# Patient Record
Sex: Female | Born: 1970 | Race: Black or African American | Hispanic: No | Marital: Single | Smoking: Former smoker
Health system: Southern US, Community
[De-identification: ages and names within clinical notes are randomized; demographics above are authoritative.]

## PROBLEM LIST (undated history)

## (undated) DIAGNOSIS — I10 Essential (primary) hypertension: Secondary | ICD-10-CM

## (undated) DIAGNOSIS — D219 Benign neoplasm of connective and other soft tissue, unspecified: Secondary | ICD-10-CM

## (undated) DIAGNOSIS — Z9289 Personal history of other medical treatment: Secondary | ICD-10-CM

## (undated) DIAGNOSIS — M5430 Sciatica, unspecified side: Secondary | ICD-10-CM

## (undated) DIAGNOSIS — F32A Depression, unspecified: Secondary | ICD-10-CM

## (undated) DIAGNOSIS — H6690 Otitis media, unspecified, unspecified ear: Secondary | ICD-10-CM

## (undated) DIAGNOSIS — F329 Major depressive disorder, single episode, unspecified: Secondary | ICD-10-CM

## (undated) DIAGNOSIS — D649 Anemia, unspecified: Secondary | ICD-10-CM

## (undated) HISTORY — DX: Sciatica, unspecified side: M54.30

## (undated) HISTORY — DX: Benign neoplasm of connective and other soft tissue, unspecified: D21.9

## (undated) HISTORY — DX: Otitis media, unspecified, unspecified ear: H66.90

## (undated) HISTORY — PX: ABLATION: SHX5711

## (undated) HISTORY — PX: TUBAL LIGATION: SHX77

## (undated) HISTORY — DX: Essential (primary) hypertension: I10

---

## 2005-06-16 DIAGNOSIS — Z9289 Personal history of other medical treatment: Secondary | ICD-10-CM

## 2005-06-16 HISTORY — DX: Personal history of other medical treatment: Z92.89

## 2005-06-16 HISTORY — PX: OTHER SURGICAL HISTORY: SHX169

## 2012-11-05 ENCOUNTER — Ambulatory Visit (INDEPENDENT_AMBULATORY_CARE_PROVIDER_SITE_OTHER): Payer: Self-pay | Admitting: Obstetrics & Gynecology

## 2012-11-05 ENCOUNTER — Encounter: Payer: Self-pay | Admitting: Obstetrics & Gynecology

## 2012-11-05 VITALS — BP 151/118 | HR 68 | Temp 97.0°F | Ht 67.0 in | Wt 140.0 lb

## 2012-11-05 DIAGNOSIS — N920 Excessive and frequent menstruation with regular cycle: Secondary | ICD-10-CM

## 2012-11-05 DIAGNOSIS — D649 Anemia, unspecified: Secondary | ICD-10-CM | POA: Insufficient documentation

## 2012-11-05 DIAGNOSIS — D259 Leiomyoma of uterus, unspecified: Secondary | ICD-10-CM | POA: Insufficient documentation

## 2012-11-05 LAB — CBC
HCT: 34.3 % — ABNORMAL LOW (ref 36.0–46.0)
MCHC: 32.7 g/dL (ref 30.0–36.0)
MCV: 83.3 fL (ref 78.0–100.0)
Platelets: 240 10*3/uL (ref 150–400)
RDW: 15.2 % (ref 11.5–15.5)
WBC: 9.3 10*3/uL (ref 4.0–10.5)

## 2012-11-05 NOTE — Progress Notes (Signed)
Patient ID: Roberta Young, female   DOB: [birth year], 42 y.o.   MRN: [id number]  Chief Complaint  Patient presents with  . Fibroids  . Menorrhagia    HPI Roberta Young is a 41 y.o. female.  [id number] Patient's last menstrual period was 11/01/2012. H/O menses lasting 7 days, heavy for 2 days. She can't function when flow is heavy. PE and CT scan shows large fibroids, up to 9 cm.   HPI  Past Medical History  Diagnosis Date  . Hypertension     Past Surgical History  Procedure Laterality Date  . Tubal ligation    . Ablation    . Blood transfusion    . Cesarean section      Family History  Problem Relation Age of Onset  . Hypertension Mother   . Hypertension Father   . Hypertension Sister   . Kidney disease Maternal Aunt   . Heart disease Maternal Grandmother   . Heart disease Maternal Grandfather     Social History History  Substance Use Topics  . Smoking status: Current Every Day Smoker -- 0.50 packs/day  . Smokeless tobacco: Not on file  . Alcohol Use: Yes    No Known Allergies  Current Outpatient Prescriptions  Medication Sig Dispense Refill  . hydrochlorothiazide (HYDRODIURIL) 25 MG tablet Take 25 mg by mouth daily.      Marland Kitchen lisinopril (PRINIVIL,ZESTRIL) 40 MG tablet Take 40 mg by mouth daily.      . polyethylene glycol (MIRALAX / GLYCOLAX) packet Take 17 g by mouth once.       No current facility-administered medications for this visit.    Review of Systems Review of Systems  Constitutional: Negative for fever.  Genitourinary: Positive for vaginal bleeding (spotting now), menstrual problem and pelvic pain (cramps not severe). Negative for vaginal discharge.  Neurological: Negative for dizziness.    Blood pressure 151/118, pulse 68, temperature 97 F (36.1 C), temperature source Oral, height 5\' 7"  (1.702 m), weight 140 lb (63.504 kg), last menstrual period 11/01/2012.  Physical Exam Physical Exam  Constitutional: She is oriented to person, place, and  time. No distress.  slender  HENT:  Poor dentition missing front tooth, decay  Pulmonary/Chest: Effort normal. No respiratory distress.  Abdominal: Soft. She exhibits mass (15 weeks size fibroid). There is tenderness.  Genitourinary: Vagina normal. No vaginal discharge found.  15 week size firm uterus with posterior fibroid displacing cervix, pap obtained but could not visualize cervix, no adnexal masses  Neurological: She is alert and oriented to person, place, and time.  Skin: Skin is warm and dry. No pallor.  Psychiatric: She has a normal mood and affect. Her behavior is normal.    Data Reviewed CT report  Assessment    Large fibroids and menorrhagia. H/O ablation. H/O transfusion     Plan    Anticipate TAH, salpingectomy. Needs financial assistance.         Meital Riehl 11/05/2012, 10:36 AM

## 2012-11-05 NOTE — Patient Instructions (Signed)
Fibroids Fibroids are lumps (tumors) that can occur any place in a woman's body. These lumps are not cancerous. Fibroids vary in size, weight, and where they grow. HOME CARE  Do not take aspirin.  Write down the number of pads or tampons you use during your period. Tell your doctor. This can help determine the best treatment for you. GET HELP RIGHT AWAY IF:  You have pain in your lower belly (abdomen) that is not helped with medicine.  You have cramps that are not helped with medicine.  You have more bleeding between or during your period.  You feel lightheaded or pass out (faint).  Your lower belly pain gets worse. MAKE SURE YOU:  Understand these instructions.  Will watch your condition.  Will get help right away if you are not doing well or get worse. Document Released: 07/05/2010 Document Revised: 08/25/2011 Document Reviewed: 07/05/2010 ExitCare Patient Information 2014 [employer].  

## 2012-11-22 ENCOUNTER — Telehealth: Payer: Self-pay | Admitting: General Practice

## 2012-11-22 DIAGNOSIS — A599 Trichomoniasis, unspecified: Secondary | ICD-10-CM

## 2012-11-22 MED ORDER — METRONIDAZOLE 500 MG PO TABS: 2000.0000 mg | ORAL_TABLET | Freq: Once | ORAL | Status: DC

## 2012-11-22 NOTE — Telephone Encounter (Signed)
Called patient and informed her of results and antibiotic available for her to pickup at her [facility] pharmacy and that it's important that she take all of the medicine, that her partner(s) get treated as well and to avoid intercourse for one week while being treated. Patient verbalized understanding and had no further questions

## 2012-11-22 NOTE — Telephone Encounter (Signed)
Message copied by Kathee Delton on Mon Nov 22, 2012 11:09 AM ------      Message from: Adam Phenix      Created: Sat Nov 20, 2012  3:14 PM       Trichomonas on pap, Rx Flagyl 2 g po single dose ------

## 2013-02-07 ENCOUNTER — Encounter: Payer: Self-pay | Admitting: Obstetrics & Gynecology

## 2013-02-07 ENCOUNTER — Ambulatory Visit (INDEPENDENT_AMBULATORY_CARE_PROVIDER_SITE_OTHER): Payer: Self-pay | Admitting: Obstetrics & Gynecology

## 2013-02-07 VITALS — BP 121/80 | HR 71 | Temp 96.9°F | Ht 62.0 in | Wt 135.7 lb

## 2013-02-07 DIAGNOSIS — D259 Leiomyoma of uterus, unspecified: Secondary | ICD-10-CM

## 2013-02-07 LAB — CBC
MCH: 26 pg (ref 26.0–34.0)
MCHC: 32.1 g/dL (ref 30.0–36.0)
Platelets: 620 10*3/uL — ABNORMAL HIGH (ref 150–400)

## 2013-02-07 MED ORDER — MEDROXYPROGESTERONE ACETATE 10 MG PO TABS: 20.0000 mg | ORAL_TABLET | Freq: Every day | ORAL | Status: DC

## 2013-02-07 MED ORDER — LEUPROLIDE ACETATE (3 MONTH) 11.25 MG IM KIT: 11.2500 mg | PACK | Freq: Once | INTRAMUSCULAR | Status: DC

## 2013-02-07 MED ORDER — FERROUS SULFATE 325 (65 FE) MG PO TABS: 325.0000 mg | ORAL_TABLET | Freq: Every day | ORAL | Status: DC

## 2013-02-07 NOTE — Progress Notes (Signed)
Patient ID: Roberta Young, female   DOB: Nov 18, 1970, 42 y.o.   MRN: [id number] Patient ID: Roberta Young, female DOB: 1971-02-11, 42 y.o. MRN: [id number]  Chief Complaint   Patient presents with   .  Fibroids   .  Menorrhagia   HPI  Roberta Young is a 42 y.o. female. [id number]  Patient's last menstrual period was 01/27/2013.  H/O menses lasting 7 days, heavy for 2 days. She can't function when flow is heavy. PE and CT scan shows large fibroids, up to 9 cm. 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  HPI  Past Medical History   Diagnosis  Date   .  Hypertension     Past Surgical History   Procedure  Laterality  Date   .  Tubal ligation     .  Ablation     .  Blood transfusion     .  Cesarean section      Family History   Problem  Relation  Age of Onset   .  Hypertension  Mother    .  Hypertension  Father    .  Hypertension  Sister    .  Kidney disease  Maternal Aunt    .  Heart disease  Maternal Grandmother    .  Heart disease  Maternal Grandfather    Social History  History   Substance Use Topics   .  Smoking status:  Current Every Day Smoker -- 0.50 packs/day   .  Smokeless tobacco:  Not on file   .  Alcohol Use:  Yes   No Known Allergies  Current Outpatient Prescriptions   Medication  Sig  Dispense  Refill   .  hydrochlorothiazide (HYDRODIURIL) 25 MG tablet  Take 25 mg by mouth daily.     Marland Kitchen  lisinopril (PRINIVIL,ZESTRIL) 40 MG tablet  Take 40 mg by mouth daily.     .  polyethylene glycol (MIRALAX / GLYCOLAX) packet  Take 17 g by mouth once.      No current facility-administered medications for this visit.   Review of Systems  Review of Systems  Constitutional: Negative for fever.  Genitourinary: Positive for vaginal bleeding (spotting now), menstrual problem and pelvic pain (cramps not severe). Negative for vaginal discharge.  Neurological: Negative for dizziness.  Filed Vitals:   02/07/13 1445  BP: 121/80  Pulse: 71  Temp: 96.9 F (36.1 C)  TempSrc: Oral  Height:  5\' 2"  (1.575 m)  Weight: 135 lb 11.2 oz (61.553 kg)    Physical Exam  Physical Exam  Constitutional: She is oriented to person, place, and time. No distress.  slender  HENT:  Poor dentition missing front tooth, decay  Pulmonary/Chest: Effort normal. No respiratory distress.  Abdominal: Soft. She exhibits mass (15 weeks size fibroid). There is tenderness mild  Genitourinary: Vagina normal. No vaginal discharge found.  15 week size firm uterus with posterior fibroid displacing cervix Neurological: She is alert and oriented to person, place, and time.  Skin: Skin is warm and dry. No pallor.  Psychiatric: She has a normal mood and affect. Her behavior is normal.  Data Reviewed   Assessment  Large fibroids and menorrhagia. H/O ablation. H/O transfusion  Plan  Anticipate TAH, salpingectomy. Lupron 11.25 mg IM, schedule EMBX if GCCD is confirmed, surgery not scheduled yet. Roberta Young  02/07/2013

## 2013-02-28 ENCOUNTER — Telehealth: Payer: Self-pay | Admitting: *Deleted

## 2013-02-28 NOTE — Telephone Encounter (Signed)
Patient left message that she needs to come and get her shot and also needs to find out about her financial application.

## 2013-03-01 NOTE — Telephone Encounter (Signed)
Called pt and informed her that we have a copy of her financial assistance acceptance letter on file. She has 100% discount however this does not cover the Lupron injection. She will need to complete a different application to determine if she qualifies for free Lupron. Pt stated she is "ready to get on with it so she can have her surgery".   She lives in [place] and has transportation issues. She will have her sister pick up the application and then bring it back completed. Pt was also informed of appt on 03/31/13 @ 1315 for endometrial biopsy. Pt voiced understanding of all information.

## 2013-03-31 ENCOUNTER — Ambulatory Visit (INDEPENDENT_AMBULATORY_CARE_PROVIDER_SITE_OTHER): Payer: Self-pay | Admitting: Obstetrics & Gynecology

## 2013-03-31 ENCOUNTER — Encounter: Payer: Self-pay | Admitting: Obstetrics & Gynecology

## 2013-03-31 ENCOUNTER — Other Ambulatory Visit (HOSPITAL_COMMUNITY)
Admission: RE | Admit: 2013-03-31 | Discharge: 2013-03-31 | Disposition: A | Discharge status: Home / Self Care | Payer: Self-pay | Source: Ambulatory Visit | Attending: Obstetrics & Gynecology | Admitting: Obstetrics & Gynecology

## 2013-03-31 VITALS — BP 133/78 | HR 73 | Temp 98.1°F | Wt 142.4 lb

## 2013-03-31 DIAGNOSIS — D649 Anemia, unspecified: Secondary | ICD-10-CM

## 2013-03-31 DIAGNOSIS — N920 Excessive and frequent menstruation with regular cycle: Secondary | ICD-10-CM

## 2013-03-31 DIAGNOSIS — D259 Leiomyoma of uterus, unspecified: Secondary | ICD-10-CM

## 2013-03-31 MED ORDER — FERROUS SULFATE 325 (65 FE) MG PO TABS: 325.0000 mg | ORAL_TABLET | Freq: Every day | ORAL | Status: DC

## 2013-03-31 NOTE — Patient Instructions (Signed)
Endometrial Biopsy This is a test in which a tissue sample (a biopsy) is taken from inside the uterus (womb). It is then looked at by a specialist under a microscope to see if the tissue is normal or abnormal. The endometrium is the lining of the uterus. This test helps determine where you are in your menstrual cycle and how hormone levels are affecting the lining of the uterus. Another use for this test is to diagnose endometrial cancer, tuberculosis, polyps, or inflammatory conditions and to evaluate uterine bleeding. PREPARATION FOR TEST No preparation or fasting is necessary. NORMAL FINDINGS No pathologic conditions. Presence of "secretory-type" endometrium 3 to 5 days before to normal menstruation. Ranges for normal findings may vary among different laboratories and hospitals. You should always check with your doctor after having lab work or other tests done to discuss the meaning of your test results and whether your values are considered within normal limits. MEANING OF TEST  Your caregiver will go over the test results with you and discuss the importance and meaning of your results, as well as treatment options and the need for additional tests if necessary. OBTAINING THE TEST RESULTS It is your responsibility to obtain your test results. Ask the lab or department performing the test when and how you will get your results. Document Released: 10/03/2004 Document Revised: 08/25/2011 Document Reviewed: 05/12/2008 ExitCare Patient Information 2014 [employer].  

## 2013-03-31 NOTE — Progress Notes (Signed)
Patient ID: Roberta Young, female   DOB: May 20, 1971, 42 y.o.   MRN: [id number] Patient's last menstrual period was 03/16/2013. [id number] Approved for [facility], here for endometrial biopsy. Will have Lupron [place] when available. Wants to schedule TAH bilateral salpingectomy..  Patient given informed consent, signed copy in the chart, time out was performed. Appropriate time out taken. . The patient was placed in the lithotomy position and the cervix brought into view with sterile speculum.  Portio of cervix cleansed x 2 with betadine swabs.  A tenaculum was placed in the posterior lip of the cervix.  The uterus was sounded for depth of 8 cm. A pipelle was introduced to into the uterus, suction created,  and an endometrial sample was obtained. All equipment was removed and accounted for.  The patient tolerated the procedure well.    Patient given post procedure instructions. The patient will return in 2 weeks for results.  FeSO4 Schedule surgery. Lupron 11.25 mg IM  Adam Phenix, MD 03/31/2013

## 2013-04-04 ENCOUNTER — Encounter: Payer: Self-pay | Admitting: Obstetrics and Gynecology

## 2013-04-04 ENCOUNTER — Telehealth: Payer: Self-pay | Admitting: Obstetrics and Gynecology

## 2013-04-04 ENCOUNTER — Telehealth: Payer: Self-pay | Admitting: *Deleted

## 2013-04-04 NOTE — Telephone Encounter (Signed)
Attempted to call patient to give message that her free Lupron has just come in and appointment made for her to get this shot. Patient's phone numbers on file were  disconnected and so was her sister's phone number. Sent a letter concerning this Lupron and appointment and to give [place] a call to make the injection appointment.

## 2013-04-04 NOTE — Telephone Encounter (Signed)
Called pt's sister Adrian Saran regarding injection appt needed. I explained that we have been unable to reach [place] today. Her free Lupron injection has arrived and an appt has been made for her on 10/23 @ 1415. If she needs another date and time, she may call the clinic to reschedule.  Quel voiced understanding and will deliver the message to [employer].

## 2013-04-06 ENCOUNTER — Encounter: Payer: Self-pay | Admitting: *Deleted

## 2013-04-07 ENCOUNTER — Ambulatory Visit: Payer: Self-pay

## 2013-04-07 ENCOUNTER — Encounter: Payer: Self-pay | Admitting: *Deleted

## 2013-04-07 ENCOUNTER — Other Ambulatory Visit: Payer: Self-pay | Admitting: Obstetrics & Gynecology

## 2013-04-11 ENCOUNTER — Ambulatory Visit (INDEPENDENT_AMBULATORY_CARE_PROVIDER_SITE_OTHER): Payer: Self-pay

## 2013-04-11 VITALS — BP 121/79 | HR 77 | Wt 142.1 lb

## 2013-04-11 DIAGNOSIS — D219 Benign neoplasm of connective and other soft tissue, unspecified: Secondary | ICD-10-CM

## 2013-04-11 DIAGNOSIS — D259 Leiomyoma of uterus, unspecified: Secondary | ICD-10-CM

## 2013-04-11 MED ORDER — LEUPROLIDE ACETATE (3 MONTH) 11.25 MG IM KIT
11.2500 mg | PACK | Freq: Once | INTRAMUSCULAR | Status: AC
  Administered 2013-04-11: 11.25 mg via INTRAMUSCULAR

## 2013-05-16 ENCOUNTER — Other Ambulatory Visit: Payer: Self-pay

## 2013-05-16 ENCOUNTER — Encounter (HOSPITAL_COMMUNITY): Payer: Self-pay

## 2013-05-16 ENCOUNTER — Encounter (HOSPITAL_COMMUNITY)
Admission: RE | Admit: 2013-05-16 | Discharge: 2013-05-16 | Disposition: A | Discharge status: Home / Self Care | Payer: Self-pay | Source: Ambulatory Visit | Attending: Obstetrics & Gynecology | Admitting: Obstetrics & Gynecology

## 2013-05-16 ENCOUNTER — Encounter (HOSPITAL_COMMUNITY): Payer: Self-pay | Admitting: Pharmacy Technician

## 2013-05-16 DIAGNOSIS — Z01812 Encounter for preprocedural laboratory examination: Secondary | ICD-10-CM | POA: Insufficient documentation

## 2013-05-16 DIAGNOSIS — Z01810 Encounter for preprocedural cardiovascular examination: Secondary | ICD-10-CM | POA: Insufficient documentation

## 2013-05-16 DIAGNOSIS — Z01818 Encounter for other preprocedural examination: Secondary | ICD-10-CM | POA: Insufficient documentation

## 2013-05-16 HISTORY — DX: Major depressive disorder, single episode, unspecified: F32.9

## 2013-05-16 HISTORY — DX: Anemia, unspecified: D64.9

## 2013-05-16 HISTORY — DX: Depression, unspecified: F32.A

## 2013-05-16 HISTORY — DX: Personal history of other medical treatment: Z92.89

## 2013-05-16 LAB — BASIC METABOLIC PANEL
CO2: 30 mEq/L (ref 19–32)
Chloride: 102 mEq/L (ref 96–112)
Creatinine, Ser: 0.9 mg/dL (ref 0.50–1.10)
GFR calc Af Amer: 89 mL/min — ABNORMAL LOW (ref >90)
Potassium: 3.6 mEq/L (ref 3.5–5.1)
Sodium: 140 mEq/L (ref 135–145)

## 2013-05-16 LAB — CBC
MCV: 84.5 fL (ref 78.0–100.0)
Platelets: 274 10*3/uL (ref 150–400)
RBC: 4.38 MIL/uL (ref 3.87–5.11)
WBC: 8.5 10*3/uL (ref 4.0–10.5)

## 2013-05-16 NOTE — Patient Instructions (Addendum)
   Your procedure is scheduled on: Wednesday, Dec 10  Enter through the [employer] of [facility] at: 7 AM Pick up the phone at the desk and dial [phone] and inform us of your arrival.  Please call this number if you have any problems the morning of surgery: [phone]  Remember: Do not eat or drink after midnight: Tuesday Take these medicines the morning of surgery with a SIP OF WATER:  Lisinopril, hctz  Do not wear jewelry, make-up, or FINGER nail polish No metal in your hair or on your body. Do not wear lotions, powders, perfumes. You may wear deodorant.  Please use your CHG wash as directed prior to surgery.  Do not shave anywhere for at least 12 hours prior to first CHG shower.  Do not bring valuables to the hospital. Contacts, dentures or bridgework may not be worn into surgery.  Leave suitcase in the car. After Surgery it may be brought to your room. For patients being admitted to the hospital, checkout time is 11:00am the day of discharge.  Home with -to be arranged prior to surgery per patient.

## 2013-05-24 MED ORDER — CEFAZOLIN SODIUM-DEXTROSE 2-3 GM-% IV SOLR
2.0000 g | INTRAVENOUS | Status: AC
  Administered 2013-05-25: 2 g via INTRAVENOUS

## 2013-05-25 ENCOUNTER — Inpatient Hospital Stay (HOSPITAL_COMMUNITY): Payer: Self-pay | Admitting: Anesthesiology

## 2013-05-25 ENCOUNTER — Encounter (HOSPITAL_COMMUNITY)
Admission: RE | Disposition: A | Discharge status: Home / Self Care | Payer: Self-pay | Source: Ambulatory Visit | Attending: Obstetrics & Gynecology

## 2013-05-25 ENCOUNTER — Encounter (HOSPITAL_COMMUNITY): Payer: Self-pay | Admitting: Anesthesiology

## 2013-05-25 ENCOUNTER — Inpatient Hospital Stay (HOSPITAL_COMMUNITY)
Admission: RE | Admit: 2013-05-25 | Discharge: 2013-05-27 | DRG: 743 | Disposition: A | Discharge status: Home / Self Care | Payer: Self-pay | Source: Ambulatory Visit | Attending: Obstetrics & Gynecology | Admitting: Obstetrics & Gynecology

## 2013-05-25 DIAGNOSIS — D251 Intramural leiomyoma of uterus: Principal | ICD-10-CM | POA: Diagnosis present

## 2013-05-25 DIAGNOSIS — D252 Subserosal leiomyoma of uterus: Secondary | ICD-10-CM

## 2013-05-25 DIAGNOSIS — F1210 Cannabis abuse, uncomplicated: Secondary | ICD-10-CM | POA: Diagnosis present

## 2013-05-25 DIAGNOSIS — D259 Leiomyoma of uterus, unspecified: Secondary | ICD-10-CM | POA: Diagnosis present

## 2013-05-25 DIAGNOSIS — F17200 Nicotine dependence, unspecified, uncomplicated: Secondary | ICD-10-CM | POA: Diagnosis present

## 2013-05-25 DIAGNOSIS — N920 Excessive and frequent menstruation with regular cycle: Secondary | ICD-10-CM

## 2013-05-25 DIAGNOSIS — D250 Submucous leiomyoma of uterus: Secondary | ICD-10-CM | POA: Diagnosis present

## 2013-05-25 DIAGNOSIS — D649 Anemia, unspecified: Secondary | ICD-10-CM | POA: Diagnosis present

## 2013-05-25 DIAGNOSIS — N852 Hypertrophy of uterus: Secondary | ICD-10-CM | POA: Diagnosis present

## 2013-05-25 HISTORY — PX: BILATERAL SALPINGECTOMY: SHX5743

## 2013-05-25 HISTORY — PX: ABDOMINAL HYSTERECTOMY: SHX81

## 2013-05-25 LAB — PREGNANCY, URINE: Preg Test, Ur: NEGATIVE

## 2013-05-25 SURGERY — HYSTERECTOMY, ABDOMINAL
Anesthesia: General | Site: Abdomen

## 2013-05-25 MED ORDER — ACETAMINOPHEN 160 MG/5ML PO SOLN
975.0000 mg | Freq: Once | ORAL | Status: AC
  Administered 2013-05-25: 975 mg via ORAL

## 2013-05-25 MED ORDER — ACETAMINOPHEN 160 MG/5ML PO SOLN
ORAL | Status: AC
  Filled 2013-05-25: qty 40.6

## 2013-05-25 MED ORDER — NALOXONE HCL 0.4 MG/ML IJ SOLN: 0.4000 mg | INTRAMUSCULAR | Status: DC | PRN

## 2013-05-25 MED ORDER — NEOSTIGMINE METHYLSULFATE 1 MG/ML IJ SOLN
INTRAMUSCULAR | Status: AC
  Filled 2013-05-25: qty 1

## 2013-05-25 MED ORDER — PROPOFOL 10 MG/ML IV EMUL
INTRAVENOUS | Status: AC
  Filled 2013-05-25: qty 20

## 2013-05-25 MED ORDER — KETOROLAC TROMETHAMINE 30 MG/ML IJ SOLN
INTRAMUSCULAR | Status: AC
  Filled 2013-05-25: qty 1

## 2013-05-25 MED ORDER — MIDAZOLAM HCL 2 MG/2ML IJ SOLN
INTRAMUSCULAR | Status: AC
  Filled 2013-05-25: qty 2

## 2013-05-25 MED ORDER — METOPROLOL TARTRATE 25 MG PO TABS
25.0000 mg | ORAL_TABLET | Freq: Two times a day (BID) | ORAL | Status: DC
  Administered 2013-05-25 – 2013-05-27 (×5): 25 mg via ORAL
  Filled 2013-05-25 (×5): qty 1

## 2013-05-25 MED ORDER — LABETALOL HCL 5 MG/ML IV SOLN
INTRAVENOUS | Status: AC
  Filled 2013-05-25: qty 4

## 2013-05-25 MED ORDER — METOCLOPRAMIDE HCL 5 MG/ML IJ SOLN: 10.0000 mg | Freq: Once | INTRAMUSCULAR | Status: DC | PRN

## 2013-05-25 MED ORDER — FENTANYL CITRATE 0.05 MG/ML IJ SOLN
INTRAMUSCULAR | Status: AC
  Filled 2013-05-25: qty 5

## 2013-05-25 MED ORDER — PHENYLEPHRINE 40 MCG/ML (10ML) SYRINGE FOR IV PUSH (FOR BLOOD PRESSURE SUPPORT)
PREFILLED_SYRINGE | INTRAVENOUS | Status: AC
  Filled 2013-05-25: qty 5

## 2013-05-25 MED ORDER — MIDAZOLAM HCL 2 MG/2ML IJ SOLN
INTRAMUSCULAR | Status: DC | PRN
  Administered 2013-05-25: 2 mg via INTRAVENOUS

## 2013-05-25 MED ORDER — HYDROMORPHONE HCL PF 1 MG/ML IJ SOLN
INTRAMUSCULAR | Status: AC
  Administered 2013-05-25: 0.5 mg via INTRAVENOUS
  Filled 2013-05-25: qty 1

## 2013-05-25 MED ORDER — NEOSTIGMINE METHYLSULFATE 1 MG/ML IJ SOLN
INTRAMUSCULAR | Status: DC | PRN
  Administered 2013-05-25: 3 mg via INTRAVENOUS

## 2013-05-25 MED ORDER — ONDANSETRON HCL 4 MG/2ML IJ SOLN
INTRAMUSCULAR | Status: AC
  Filled 2013-05-25: qty 2

## 2013-05-25 MED ORDER — BUPIVACAINE HCL (PF) 0.5 % IJ SOLN
INTRAMUSCULAR | Status: AC
  Filled 2013-05-25: qty 30

## 2013-05-25 MED ORDER — LABETALOL HCL 5 MG/ML IV SOLN
INTRAVENOUS | Status: DC | PRN
  Administered 2013-05-25: 5 mg via INTRAVENOUS
  Administered 2013-05-25: 10 mg via INTRAVENOUS
  Administered 2013-05-25: 5 mg via INTRAVENOUS
  Administered 2013-05-25 (×2): 10 mg via INTRAVENOUS

## 2013-05-25 MED ORDER — DIPHENHYDRAMINE HCL 50 MG/ML IJ SOLN: 12.5000 mg | Freq: Four times a day (QID) | INTRAMUSCULAR | Status: DC | PRN

## 2013-05-25 MED ORDER — ONDANSETRON HCL 4 MG PO TABS: 4.0000 mg | ORAL_TABLET | Freq: Four times a day (QID) | ORAL | Status: DC | PRN

## 2013-05-25 MED ORDER — DEXAMETHASONE SODIUM PHOSPHATE 10 MG/ML IJ SOLN
INTRAMUSCULAR | Status: AC
  Filled 2013-05-25: qty 1

## 2013-05-25 MED ORDER — GLYCOPYRROLATE 0.2 MG/ML IJ SOLN
INTRAMUSCULAR | Status: DC | PRN
  Administered 2013-05-25: 0.6 mg via INTRAVENOUS

## 2013-05-25 MED ORDER — HYDRALAZINE HCL 20 MG/ML IJ SOLN
INTRAMUSCULAR | Status: AC
  Filled 2013-05-25: qty 1

## 2013-05-25 MED ORDER — DEXAMETHASONE SODIUM PHOSPHATE 10 MG/ML IJ SOLN
INTRAMUSCULAR | Status: DC | PRN
  Administered 2013-05-25: 10 mg via INTRAVENOUS

## 2013-05-25 MED ORDER — ROCURONIUM BROMIDE 100 MG/10ML IV SOLN
INTRAVENOUS | Status: DC | PRN
  Administered 2013-05-25: 50 mg via INTRAVENOUS

## 2013-05-25 MED ORDER — KETOROLAC TROMETHAMINE 30 MG/ML IJ SOLN: 30.0000 mg | Freq: Four times a day (QID) | INTRAMUSCULAR | Status: DC

## 2013-05-25 MED ORDER — HYDROMORPHONE HCL PF 1 MG/ML IJ SOLN
0.2500 mg | INTRAMUSCULAR | Status: DC | PRN
  Administered 2013-05-25 (×2): 0.5 mg via INTRAVENOUS

## 2013-05-25 MED ORDER — NALOXONE HCL 0.4 MG/ML IJ SOLN
INTRAMUSCULAR | Status: DC | PRN
  Administered 2013-05-25: 100 ug via INTRAVENOUS

## 2013-05-25 MED ORDER — ONDANSETRON HCL 4 MG/2ML IJ SOLN
4.0000 mg | Freq: Four times a day (QID) | INTRAMUSCULAR | Status: DC | PRN
  Administered 2013-05-25: 4 mg via INTRAVENOUS

## 2013-05-25 MED ORDER — HYDROMORPHONE 0.3 MG/ML IV SOLN
INTRAVENOUS | Status: DC
  Administered 2013-05-25: 13:00:00 via INTRAVENOUS
  Administered 2013-05-25 (×2): 1.39 mg via INTRAVENOUS
  Administered 2013-05-25: 0.59 mg via INTRAVENOUS
  Administered 2013-05-26 (×2): 0.2 mg via INTRAVENOUS
  Filled 2013-05-25 (×2): qty 25

## 2013-05-25 MED ORDER — HYDROMORPHONE HCL PF 1 MG/ML IJ SOLN
INTRAMUSCULAR | Status: AC
  Filled 2013-05-25: qty 1

## 2013-05-25 MED ORDER — SODIUM CHLORIDE 0.9 % IJ SOLN: 9.0000 mL | INTRAMUSCULAR | Status: DC | PRN

## 2013-05-25 MED ORDER — MEPERIDINE HCL 25 MG/ML IJ SOLN: 6.2500 mg | INTRAMUSCULAR | Status: DC | PRN

## 2013-05-25 MED ORDER — OXYCODONE-ACETAMINOPHEN 5-325 MG PO TABS
1.0000 | ORAL_TABLET | ORAL | Status: DC | PRN
  Administered 2013-05-26: 2 via ORAL
  Administered 2013-05-26 (×2): 1 via ORAL
  Administered 2013-05-27: 2 via ORAL
  Filled 2013-05-25: qty 1
  Filled 2013-05-25 (×2): qty 2
  Filled 2013-05-25: qty 1

## 2013-05-25 MED ORDER — TEMAZEPAM 15 MG PO CAPS: 15.0000 mg | ORAL_CAPSULE | Freq: Every evening | ORAL | Status: DC | PRN

## 2013-05-25 MED ORDER — GLYCOPYRROLATE 0.2 MG/ML IJ SOLN
INTRAMUSCULAR | Status: AC
  Filled 2013-05-25: qty 3

## 2013-05-25 MED ORDER — LIDOCAINE HCL (CARDIAC) 20 MG/ML IV SOLN
INTRAVENOUS | Status: DC | PRN
  Administered 2013-05-25: 30 mg via INTRAVENOUS
  Administered 2013-05-25: 70 mg via INTRAVENOUS

## 2013-05-25 MED ORDER — FENTANYL CITRATE 0.05 MG/ML IJ SOLN
INTRAMUSCULAR | Status: DC | PRN
  Administered 2013-05-25: 100 ug via INTRAVENOUS
  Administered 2013-05-25 (×6): 50 ug via INTRAVENOUS

## 2013-05-25 MED ORDER — LIDOCAINE HCL (CARDIAC) 20 MG/ML IV SOLN
INTRAVENOUS | Status: AC
  Filled 2013-05-25: qty 5

## 2013-05-25 MED ORDER — DIPHENHYDRAMINE HCL 12.5 MG/5ML PO ELIX: 12.5000 mg | ORAL_SOLUTION | Freq: Four times a day (QID) | ORAL | Status: DC | PRN

## 2013-05-25 MED ORDER — LACTATED RINGERS IV SOLN: INTRAVENOUS | Status: DC

## 2013-05-25 MED ORDER — PHENYLEPHRINE HCL 10 MG/ML IJ SOLN
INTRAMUSCULAR | Status: DC | PRN
  Administered 2013-05-25: 40 ug via INTRAVENOUS
  Administered 2013-05-25: 80 ug via INTRAVENOUS

## 2013-05-25 MED ORDER — ROCURONIUM BROMIDE 100 MG/10ML IV SOLN
INTRAVENOUS | Status: AC
  Filled 2013-05-25: qty 1

## 2013-05-25 MED ORDER — ONDANSETRON HCL 4 MG/2ML IJ SOLN
INTRAMUSCULAR | Status: DC | PRN
  Administered 2013-05-25: 4 mg via INTRAVENOUS

## 2013-05-25 MED ORDER — LACTATED RINGERS IV SOLN
INTRAVENOUS | Status: DC
  Administered 2013-05-25: 19:00:00 via INTRAVENOUS

## 2013-05-25 MED ORDER — LABETALOL HCL 5 MG/ML IV SOLN: 10.0000 mg | Freq: Once | INTRAVENOUS | Status: DC

## 2013-05-25 MED ORDER — KETOROLAC TROMETHAMINE 30 MG/ML IJ SOLN
30.0000 mg | Freq: Four times a day (QID) | INTRAMUSCULAR | Status: DC
  Administered 2013-05-25 – 2013-05-26 (×3): 30 mg via INTRAVENOUS
  Filled 2013-05-25 (×3): qty 1

## 2013-05-25 MED ORDER — ONDANSETRON HCL 4 MG/2ML IJ SOLN
4.0000 mg | Freq: Four times a day (QID) | INTRAMUSCULAR | Status: DC | PRN
  Filled 2013-05-25: qty 2

## 2013-05-25 MED ORDER — LACTATED RINGERS IV SOLN
INTRAVENOUS | Status: DC
  Administered 2013-05-25 (×2): via INTRAVENOUS

## 2013-05-25 MED ORDER — HYDRALAZINE HCL 20 MG/ML IJ SOLN: 20.0000 mg | Freq: Once | INTRAMUSCULAR | Status: DC

## 2013-05-25 MED ORDER — PROPOFOL 10 MG/ML IV BOLUS
INTRAVENOUS | Status: DC | PRN
  Administered 2013-05-25: 170 mg via INTRAVENOUS

## 2013-05-25 MED ORDER — BUPIVACAINE HCL (PF) 0.5 % IJ SOLN
INTRAMUSCULAR | Status: DC | PRN
  Administered 2013-05-25: 10 mL

## 2013-05-25 SURGICAL SUPPLY — 32 items
CANISTER SUCT 3000ML (MISCELLANEOUS) ×3 IMPLANT
CHLORAPREP W/TINT 26ML (MISCELLANEOUS) ×3 IMPLANT
CLOTH BEACON ORANGE TIMEOUT ST (SAFETY) ×3 IMPLANT
CONT PATH 16OZ SNAP LID 3702 (MISCELLANEOUS) ×3 IMPLANT
DRAPE WARM FLUID 44X44 (DRAPE) ×3 IMPLANT
DRSG OPSITE POSTOP 4X10 (GAUZE/BANDAGES/DRESSINGS) ×3 IMPLANT
GAUZE SPONGE 4X4 16PLY XRAY LF (GAUZE/BANDAGES/DRESSINGS) ×3 IMPLANT
GLOVE BIO SURGEON STRL SZ 6.5 (GLOVE) ×3 IMPLANT
GLOVE BIOGEL PI IND STRL 7.0 (GLOVE) ×2 IMPLANT
GLOVE BIOGEL PI INDICATOR 7.0 (GLOVE) ×1
GOWN PREVENTION PLUS LG XLONG (DISPOSABLE) ×9 IMPLANT
NEEDLE HYPO 22GX1.5 SAFETY (NEEDLE) ×3 IMPLANT
NS IRRIG 1000ML POUR BTL (IV SOLUTION) ×3 IMPLANT
PACK ABDOMINAL GYN (CUSTOM PROCEDURE TRAY) ×3 IMPLANT
PAD ABD 7.5X8 STRL (GAUZE/BANDAGES/DRESSINGS) ×3 IMPLANT
PAD OB MATERNITY 4.3X12.25 (PERSONAL CARE ITEMS) ×3 IMPLANT
PROTECTOR NERVE ULNAR (MISCELLANEOUS) ×3 IMPLANT
SPONGE LAP 18X18 X RAY DECT (DISPOSABLE) ×6 IMPLANT
STAPLER VISISTAT 35W (STAPLE) IMPLANT
SUT VIC AB 0 CT1 18XCR BRD8 (SUTURE) ×6 IMPLANT
SUT VIC AB 0 CT1 27 (SUTURE) ×1
SUT VIC AB 0 CT1 27XBRD ANBCTR (SUTURE) ×2 IMPLANT
SUT VIC AB 0 CT1 36 (SUTURE) ×6 IMPLANT
SUT VIC AB 0 CT1 8-18 (SUTURE) ×3
SUT VIC AB 2-0 CT1 27 (SUTURE) ×1
SUT VIC AB 2-0 CT1 TAPERPNT 27 (SUTURE) ×2 IMPLANT
SUT VIC AB 4-0 PS2 27 (SUTURE) ×3 IMPLANT
SUT VICRYL 0 TIES 12 18 (SUTURE) ×3 IMPLANT
SYR CONTROL 10ML LL (SYRINGE) ×3 IMPLANT
TOWEL OR 17X24 6PK STRL BLUE (TOWEL DISPOSABLE) ×6 IMPLANT
TRAY FOLEY CATH 14FR (SET/KITS/TRAYS/PACK) ×3 IMPLANT
WATER STERILE IRR 1000ML POUR (IV SOLUTION) ×3 IMPLANT

## 2013-05-25 NOTE — Transfer of Care (Signed)
Immediate Anesthesia Transfer of Care Note  Patient: Roberta Young  Procedure(s) Performed: Procedure(s): HYSTERECTOMY ABDOMINAL (N/A) BILATERAL SALPINGECTOMY (Bilateral)  Patient Location: PACU  Anesthesia Type:General  Level of Consciousness: awake, sedated and patient cooperative  Airway & Oxygen Therapy: Patient Spontanous Breathing and Patient connected to nasal cannula oxygen  Post-op Assessment: Report given to PACU RN, Post -op Vital signs reviewed and unstable, Anesthesiologist notified and Patient moving all extremities  Post vital signs: Reviewed and stable  Complications: No apparent anesthesia complications

## 2013-05-25 NOTE — Anesthesia Postprocedure Evaluation (Signed)
  Anesthesia Post-op Note  Patient: Roberta Young  Procedure(s) Performed: Procedure(s): HYSTERECTOMY ABDOMINAL (N/A) BILATERAL SALPINGECTOMY (Bilateral)  Patient Location: PACU  Anesthesia Type:General  Level of Consciousness: awake, alert  and oriented  Airway and Oxygen Therapy: Patient Spontanous Breathing  Post-op Pain: none  Post-op Assessment: Post-op Vital signs reviewed, Patient's Cardiovascular Status Stable, Respiratory Function Stable, Patent Airway, No signs of Nausea or vomiting and Pain level controlled. BP has been somewhat difficult to control. Discussed with Dr. Debroah Loop.  Post-op Vital Signs: Reviewed and stable  Complications: No apparent anesthesia complications

## 2013-05-25 NOTE — H&P (Signed)
Roberta Young is an 42 y.o. female. [id number] No LMP recorded. HIstory of fibroid uterus, menorrhagia, anemia, previous endometrial ablation, admitted today for TAH/bilateral salpingectomy. The procedure and the risks of anesthesia, bleeding, transfusion, visceral organ damage, pain, infection were discussed and her questions were answered. She had lupron IM in October.  Pertinent Gynecological History: Menses: heavy  Contraception: tubal ligation DES exposure: denies Blood transfusions: 4 units 2007 [facility] Sexually transmitted diseases: no past history Previous GYN Procedures: endometrial ablation   Last pap: normal Date: 2014 Endometrial ablation OB History: G3, P3   Menstrual History:  No LMP recorded.    Past Medical History  Diagnosis Date  . Hypertension   . Fibroids   . SVD (spontaneous vaginal delivery)     x 2  . Depression   . Anemia   . [facility]    Past Surgical History  Procedure Laterality Date  . Tubal ligation    . Ablation    . Blood transfusion  2007    ? 4 [facility] at [facility]  . Cesarean section      x 1    Family History  Problem Relation Age of Onset  . Hypertension Mother   . Hypertension Father   . Hypertension Sister   . Kidney disease Maternal Aunt   . Heart disease Maternal Grandmother   . Heart disease Maternal Grandfather     Social History:  reports that she has been smoking Cigarettes.  She has a 13.5 pack-year smoking history. She has never used smokeless tobacco. She reports that she drinks alcohol. She reports that she uses illicit drugs (Marijuana) about twice per week.  Allergies: No Known Allergies  Facility-administered medications prior to admission  Medication Dose Route Frequency Provider Last Rate Last Dose  . leuprolide (LUPRON) injection 11.25 mg  11.25 mg Intramuscular Once Roberta Phenix, MD       Prescriptions prior to admission   Medication Sig Dispense Refill  . ferrous sulfate 325 (65 FE) MG tablet Take 1 tablet (325 mg total) by mouth daily with breakfast.  60 tablet  3  . hydrochlorothiazide (HYDRODIURIL) 25 MG tablet Take 25 mg by mouth daily.      Marland Kitchen lisinopril (PRINIVIL,ZESTRIL) 40 MG tablet Take 40 mg by mouth daily.        Review of Systems  Constitutional: Negative for fever.       Vasomotor symptoms  Respiratory: Negative for cough and shortness of breath.   Genitourinary:       Cramping  Neurological: Negative for headaches.    Blood pressure 126/97, pulse 69, temperature 97.7 F (36.5 C), temperature source Oral, resp. rate 20, SpO2 100.00%. Physical Exam  Constitutional: She is oriented to person, place, and time. She appears well-developed. No distress.  HENT:  Head: Normocephalic.  Neck: Normal range of motion.  Cardiovascular: Normal rate.   Respiratory: Effort normal. No respiratory distress.  GI: Soft. She exhibits mass (firm lower abdomen fibroid uterus).  Musculoskeletal: She exhibits no edema and no tenderness.  Neurological: She is alert and oriented to person, place, and time.  Skin: Skin is warm and dry.  Psychiatric: She has a normal mood and affect. Her behavior is normal.    Results for orders placed during the hospital encounter of 05/25/13 (from the past 24 hour(s))  PREGNANCY, URINE     Status: None   Collection Time    05/25/13  6:30  AM      Result Value Range   Preg Test, Ur NEGATIVE  NEGATIVE   CBC    Component Value Date/Time   WBC 8.5 05/16/2013 1434   RBC 4.38 05/16/2013 1434   HGB 12.0 05/16/2013 1434   HCT 37.0 05/16/2013 1434   PLT 274 05/16/2013 1434   MCV 84.5 05/16/2013 1434   MCH 27.4 05/16/2013 1434   MCHC 32.4 05/16/2013 1434   RDW 18.7* 05/16/2013 1434    CMP     Component Value Date/Time   NA 140 05/16/2013 1434   K 3.6 05/16/2013 1434   CL 102 05/16/2013 1434   CO2 30 05/16/2013 1434   GLUCOSE 69* 05/16/2013 1434   BUN 11 05/16/2013 1434    CREATININE 0.90 05/16/2013 1434   CALCIUM 9.7 05/16/2013 1434   GFRNONAA 77* 05/16/2013 1434   GFRAA 89* 05/16/2013 1434     CT scan in [facility] showed large fibroid uterus Assessment/Plan: Menorrhagia, h/o anemia, fibroids for planned surgery today. Sign consent.  Roberta Young 05/25/2013, 7:57 AM

## 2013-05-25 NOTE — Op Note (Signed)
Procedure: Total abdominal hysterectomy and bilateral salpingectomy Preoperative diagnosis: Fibroid uterus, menorrhagia and history of anemia Postoperative diagnosis: Same Surgeon: Dr. Scheryl Darter Assistant: Dr. Nicholaus Bloom Anesthesia: Gen. endotracheal Estimated blood loss: 100 mL Specimen: Uterus and fallopian tubes Complications: None Counts: Correct Drains: Foley catheter   Patient gave written consent for total abdominal hysterectomy and bilateral salpingectomy due to fibroid uterus and history of menorrhagia and anemia. Patient identification was confirmed and she was brought to the operating room and general anesthesia was induced. She was placed in dorsal supine position. Exam revealed a fibroid uterus about 14 weeks size. Perineum and vagina and abdomen were sterilely prepped and draped. Foley catheter was placed. Patient received 2 g of IV cefazolin. #10 blade was used to make a Pfannenstiel incision at the site of her previous cesarean section scar. Incision was carried down to the fascia and the fascia was incised and the incision was. Transversely with scissors. Fascia was separated from underlying tissue attachments with blunt and sharp dissection. The rectus muscles were separated midline. Underlying peritoneum was entered and incision was extended. Fibroid uterus was identified with normal tubes and ovaries. Bowel was packed back with moist lap pads. Uterus was excised curettage. Coker clamps were placed across the adnexa proximal to the tubes and ovaries. The adnexal pedicles were cut and suture ligated. The round ligaments were Suture ligated and and bladder flap was created. Uterine vessels were skeletonized and clamped cut and suture ligated with 0 Vicryl. Uterosacral ligaments and cardinal ligaments were likewise clamped cut and suture ligated. The vagina was entered and Jorgenson scissors were used to excise the cervix from the upper vagina and specimen was removed. The vaginal cuff  was closed with a running locking suture with 0 Vicryl. Hemostatic sutures with 0 Vicryl were placed to assure good hemostasis at the cuff. The fallopian tubes were elevated the mesosalpinx was clamped and tubes were excised and pedicles were suture ligated. Hemostasis was seen. Pelvis was irrigated. All packs were removed. Anterior peritoneum was closed with a running suture with 2-0 Vicryl. Fascia was closed with running suture with 0 Vicryl. Venous stasis was seen in the incision. Half percent Marcaine was infiltrated into the skin. Skin was closed with a running subcuticular suture with 4-0 Vicryl. Sterile dressing was applied. Patient tolerated the procedure well without complications and she was brought in stable condition to the PACU. During the procedure she had elevated blood pressures treated with IV labetalol.  Dr. Scheryl Darter  05/25/2013 10:07 AM

## 2013-05-25 NOTE — Anesthesia Preprocedure Evaluation (Addendum)
Anesthesia Evaluation  Patient identified by MRN, date of birth, ID band Patient awake    Reviewed: Allergy & Precautions, H&P , NPO status , Patient's Chart, lab work & pertinent test results  Airway Mallampati: III TM Distance: >3 FB Neck ROM: Full    Dental no notable dental hx. (+) Missing, Poor Dentition and Chipped   Pulmonary Current Smoker,  breath sounds clear to auscultation  Pulmonary exam normal       Cardiovascular hypertension, Pt. on medications Rhythm:Regular Rate:Normal     Neuro/Psych PSYCHIATRIC DISORDERS Depression    GI/Hepatic negative GI ROS, Neg liver ROS,   Endo/Other  negative endocrine ROS  Renal/GU negative Renal ROS  negative genitourinary   Musculoskeletal negative musculoskeletal ROS (+)   Abdominal   Peds  Hematology  (+) anemia ,   Anesthesia Other Findings   Reproductive/Obstetrics Leiomyoma uterii menorrhagia                         Anesthesia Physical Anesthesia Plan  ASA: II  Anesthesia Plan: General   Post-op Pain Management:    Induction: Intravenous  Airway Management Planned: Oral ETT  Additional Equipment:   Intra-op Plan:   Post-operative Plan: Extubation in OR  Informed Consent: I have reviewed the patients History and Physical, chart, labs and discussed the procedure including the risks, benefits and alternatives for the proposed anesthesia with the patient or authorized representative who has indicated his/her understanding and acceptance.   Dental advisory given  Plan Discussed with: CRNA, Anesthesiologist and Surgeon  Anesthesia Plan Comments:         Anesthesia Quick Evaluation

## 2013-05-26 ENCOUNTER — Encounter (HOSPITAL_COMMUNITY): Payer: Self-pay | Admitting: Obstetrics & Gynecology

## 2013-05-26 LAB — CBC
Hemoglobin: 10.2 g/dL — ABNORMAL LOW (ref 12.0–15.0)
MCH: 27.3 pg (ref 26.0–34.0)
MCV: 84.2 fL (ref 78.0–100.0)
Platelets: 264 10*3/uL (ref 150–400)
RBC: 3.73 MIL/uL — ABNORMAL LOW (ref 3.87–5.11)
RDW: 18.1 % — ABNORMAL HIGH (ref 11.5–15.5)
WBC: 16.4 10*3/uL — ABNORMAL HIGH (ref 4.0–10.5)

## 2013-05-26 MED ORDER — HYDROCHLOROTHIAZIDE 25 MG PO TABS
25.0000 mg | ORAL_TABLET | Freq: Every day | ORAL | Status: DC
  Administered 2013-05-26 – 2013-05-27 (×2): 25 mg via ORAL
  Filled 2013-05-26 (×2): qty 1

## 2013-05-26 MED ORDER — IBUPROFEN 600 MG PO TABS
600.0000 mg | ORAL_TABLET | Freq: Four times a day (QID) | ORAL | Status: DC | PRN
  Administered 2013-05-26 (×2): 600 mg via ORAL
  Filled 2013-05-26 (×2): qty 1

## 2013-05-26 MED ORDER — LISINOPRIL 40 MG PO TABS
40.0000 mg | ORAL_TABLET | Freq: Every day | ORAL | Status: DC
  Administered 2013-05-26 – 2013-05-27 (×2): 40 mg via ORAL
  Filled 2013-05-26 (×2): qty 1

## 2013-05-26 NOTE — Progress Notes (Signed)
1 Day Post-Op Procedure(s) (LRB): HYSTERECTOMY ABDOMINAL (N/A) BILATERAL SALPINGECTOMY (Bilateral)  Subjective: Patient reports incisional pain and tolerating PO.    Objective: I have reviewed patient's vital signs and medications.  General: alert, cooperative and no distress GI: soft, non-tender; bowel sounds normal; no masses,  no organomegaly and incision: clean, dry and intact Extremities: extremities normal, atraumatic, no cyanosis or edema  Assessment: s/p Procedure(s): HYSTERECTOMY ABDOMINAL (N/A) BILATERAL SALPINGECTOMY (Bilateral): progressing well  Plan: Advance diet  LOS: 1 day    Roberta Young 05/26/2013, 9:03 AM

## 2013-05-26 NOTE — Progress Notes (Signed)
UR completed 

## 2013-05-27 MED ORDER — OXYCODONE-ACETAMINOPHEN 5-325 MG PO TABS: 1.0000 | ORAL_TABLET | ORAL | Status: DC | PRN

## 2013-05-27 MED ORDER — ESTRADIOL 1 MG PO TABS: 1.0000 mg | ORAL_TABLET | Freq: Every day | ORAL | Status: DC

## 2013-05-27 NOTE — Progress Notes (Signed)
Pt is discharged in the care of Sister. Downstairs per ambulatory. Denies pain or discomfort. Discharge instructions were given to pt. Questions were asked and answered. Abdominal inscions is clean  And dry. Honeycomb in place.

## 2013-05-27 NOTE — Discharge Summary (Addendum)
Physician Discharge Summary  Patient ID: Roberta Young MRN: [id number] DOB/AGE: 1971-02-04 42 y.o.  Admit date: 05/25/2013 Discharge date: 05/27/2013  Admission Diagnoses:fibroid uterus, menorrhagia, anemia history  Discharge Diagnoses: same, post hysterectomy Active Problems:   * No active hospital problems. *   Discharged Condition: good  Hospital Course: G3P3003, No LMP recorded. 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   Consults: None  Significant Diagnostic Studies: labs:  CBC    Component Value Date/Time   WBC 16.4* 05/26/2013 0600   RBC 3.73* 05/26/2013 0600   HGB 10.2* 05/26/2013 0600   HCT 31.4* 05/26/2013 0600   PLT 264 05/26/2013 0600   MCV 84.2 05/26/2013 0600   MCH 27.3 05/26/2013 0600   MCHC 32.5 05/26/2013 0600   RDW 18.1* 05/26/2013 0600      Treatments: surgery:   TAH and bilateral salpingectomy  Discharge Exam: Blood pressure 140/82, pulse 66, temperature 98.1 F (36.7 C), temperature source Oral, resp. rate 18, height 5\' 6"  (1.676 m), weight 143 lb (64.864 kg), SpO2 100.00%. General appearance: alert, cooperative and no distress GI: soft, non-tender; bowel sounds normal; no masses,  no organomegaly and incision intact and dry  Disposition: Discharge to home    Medication List         ferrous sulfate 325 (65 FE) MG tablet  Take 1 tablet (325 mg total) by mouth daily with breakfast.     hydrochlorothiazide 25 MG tablet  Commonly known as:  HYDRODIURIL  Take 25 mg by mouth daily.     lisinopril 40 MG tablet  Commonly known as:  PRINIVIL,ZESTRIL  Take 40 mg by mouth daily.     oxyCODONE-acetaminophen 5-325 MG per tablet  Commonly known as:  PERCOCET/ROXICET  Take 1-2 tablets by mouth every 4 (four) hours as needed for severe pain (moderate to severe pain (when tolerating fluids)).      Estrace 1 mg  daily     Follow-up Information   Follow up with [facility] In 4 weeks.   Contact information:   [address] [place] [address] [phone]       Signed: Scheryl Darter 05/27/2013, 7:48 AM

## 2013-06-02 ENCOUNTER — Telehealth: Payer: Self-pay | Admitting: *Deleted

## 2013-06-02 ENCOUNTER — Ambulatory Visit: Payer: Self-pay | Admitting: Obstetrics & Gynecology

## 2013-06-02 NOTE — Telephone Encounter (Signed)
Roberta Young called and states pharmacy told her to call us for a refill. Called [facility] and she wants a refill on her percocet. States her pain is a 5.  States she is having pain on left side. Also reports pain is worse on right side of incision and the wound came open a few days ago and is the size of an eye.  States it is having a discharge , not exactly like pus. Informed her we would need to see her in clinic and gave her a work in appointment of 1030 today. Per chart had a TAH/BSo on 05/25/13. And percocet 30 tabs on 05/27/13.

## 2013-06-27 ENCOUNTER — Ambulatory Visit: Payer: Self-pay

## 2013-06-29 ENCOUNTER — Ambulatory Visit (INDEPENDENT_AMBULATORY_CARE_PROVIDER_SITE_OTHER): Payer: Self-pay | Admitting: Obstetrics & Gynecology

## 2013-06-29 ENCOUNTER — Encounter: Payer: Self-pay | Admitting: Obstetrics & Gynecology

## 2013-06-29 VITALS — BP 110/75 | HR 88 | Temp 98.2°F | Ht 66.5 in | Wt 141.0 lb

## 2013-06-29 DIAGNOSIS — D259 Leiomyoma of uterus, unspecified: Secondary | ICD-10-CM | POA: Insufficient documentation

## 2013-06-29 DIAGNOSIS — Z09 Encounter for follow-up examination after completed treatment for conditions other than malignant neoplasm: Secondary | ICD-10-CM

## 2013-06-29 DIAGNOSIS — Z9889 Other specified postprocedural states: Secondary | ICD-10-CM

## 2013-06-29 NOTE — Progress Notes (Signed)
Subjective:had some slight wound drainage     Roberta Young is a 43 y.o. female who presents to the clinic 5 weeks status post total abdominal hysterectomy for fibroids. Eating a regular diet without difficulty. Bowel movements are normal. The patient is not having any pain.  The following portions of the patient's history were reviewed and updated as appropriate: allergies, current medications, past family history, past medical history, past social history, past surgical history and problem list.  Review of Systems Pertinent items are noted in HPI.    Objective:    BP 110/75  Pulse 88  Temp(Src) 98.2 F (36.8 C)  Ht 5' 6.5" (1.689 m)  Wt 141 lb (63.957 kg)  BMI 22.42 kg/m2  LMP 03/16/2013 General:  alert, cooperative and no distress  Abdomen: soft, non-tender  Incision:   healing well, no drainage, no erythema, no hernia, no seroma, no swelling, no dehiscence, incision well approximated     Assessment:    Doing well postoperatively. Operative findings again reviewed. Pathology report discussed.    Plan:    1. Continue any current medications. 2. Wound care discussed. 3. Activity restrictions: none 4. Anticipated return to work: now. 5. Follow up: 1 year for suture removal.   Woodroe Mode, MD 06/29/2013

## 2013-06-29 NOTE — Patient Instructions (Signed)

## 2013-06-29 NOTE — Progress Notes (Signed)
Pt states her surgical incision is swollen on the Rt side.

## 2013-07-18 ENCOUNTER — Encounter: Payer: Self-pay | Admitting: *Deleted

## 2014-04-17 ENCOUNTER — Encounter: Payer: Self-pay | Admitting: Obstetrics & Gynecology

## 2015-06-12 ENCOUNTER — Emergency Department (HOSPITAL_BASED_OUTPATIENT_CLINIC_OR_DEPARTMENT_OTHER)
Admission: EM | Admit: 2015-06-12 | Discharge: 2015-06-12 | Disposition: A | Discharge status: Home / Self Care | Payer: Self-pay | Attending: Emergency Medicine | Admitting: Emergency Medicine

## 2015-06-12 ENCOUNTER — Encounter (HOSPITAL_BASED_OUTPATIENT_CLINIC_OR_DEPARTMENT_OTHER): Payer: Self-pay | Admitting: *Deleted

## 2015-06-12 ENCOUNTER — Emergency Department (HOSPITAL_BASED_OUTPATIENT_CLINIC_OR_DEPARTMENT_OTHER): Payer: Self-pay

## 2015-06-12 DIAGNOSIS — Z8669 Personal history of other diseases of the nervous system and sense organs: Secondary | ICD-10-CM | POA: Insufficient documentation

## 2015-06-12 DIAGNOSIS — Z793 Long term (current) use of hormonal contraceptives: Secondary | ICD-10-CM | POA: Insufficient documentation

## 2015-06-12 DIAGNOSIS — N390 Urinary tract infection, site not specified: Secondary | ICD-10-CM | POA: Insufficient documentation

## 2015-06-12 DIAGNOSIS — Z8659 Personal history of other mental and behavioral disorders: Secondary | ICD-10-CM | POA: Insufficient documentation

## 2015-06-12 DIAGNOSIS — Z79899 Other long term (current) drug therapy: Secondary | ICD-10-CM | POA: Insufficient documentation

## 2015-06-12 DIAGNOSIS — D649 Anemia, unspecified: Secondary | ICD-10-CM | POA: Insufficient documentation

## 2015-06-12 DIAGNOSIS — I10 Essential (primary) hypertension: Secondary | ICD-10-CM | POA: Insufficient documentation

## 2015-06-12 DIAGNOSIS — Z792 Long term (current) use of antibiotics: Secondary | ICD-10-CM | POA: Insufficient documentation

## 2015-06-12 DIAGNOSIS — Z86018 Personal history of other benign neoplasm: Secondary | ICD-10-CM | POA: Insufficient documentation

## 2015-06-12 DIAGNOSIS — F17210 Nicotine dependence, cigarettes, uncomplicated: Secondary | ICD-10-CM | POA: Insufficient documentation

## 2015-06-12 LAB — COMPREHENSIVE METABOLIC PANEL
ALT: 30 U/L (ref 14–54)
AST: 31 U/L (ref 15–41)
Albumin: 3.8 g/dL (ref 3.5–5.0)
Alkaline Phosphatase: 104 U/L (ref 38–126)
Anion gap: 7 (ref 5–15)
BUN: 10 mg/dL (ref 6–20)
CO2: 25 mmol/L (ref 22–32)
Calcium: 8.9 mg/dL (ref 8.9–10.3)
Chloride: 104 mmol/L (ref 101–111)
Creatinine, Ser: 0.75 mg/dL (ref 0.44–1.00)
GFR calc Af Amer: >60 mL/min (ref >60)
GFR calc non Af Amer: >60 mL/min (ref >60)
Glucose, Bld: 98 mg/dL (ref 65–99)
Potassium: 3.6 mmol/L (ref 3.5–5.1)
Sodium: 136 mmol/L (ref 135–145)
Total Bilirubin: 1.1 mg/dL (ref 0.3–1.2)
Total Protein: 7.6 g/dL (ref 6.5–8.1)

## 2015-06-12 LAB — CBC WITH DIFFERENTIAL/PLATELET
Basophils Absolute: 0 10*3/uL (ref 0.0–0.1)
Basophils Relative: 0 %
Eosinophils Absolute: 0 10*3/uL (ref 0.0–0.7)
Eosinophils Relative: 0 %
HCT: 42 % (ref 36.0–46.0)
Hemoglobin: 14.3 g/dL (ref 12.0–15.0)
Lymphocytes Relative: 9 %
Lymphs Abs: 0.9 10*3/uL (ref 0.7–4.0)
MCH: 29.4 pg (ref 26.0–34.0)
MCHC: 34 g/dL (ref 30.0–36.0)
MCV: 86.2 fL (ref 78.0–100.0)
Monocytes Absolute: 1.1 10*3/uL — ABNORMAL HIGH (ref 0.1–1.0)
Monocytes Relative: 11 %
Neutro Abs: 7.8 10*3/uL — ABNORMAL HIGH (ref 1.7–7.7)
Neutrophils Relative %: 80 %
Platelets: 257 10*3/uL (ref 150–400)
RBC: 4.87 MIL/uL (ref 3.87–5.11)
RDW: 12.3 % (ref 11.5–15.5)
WBC: 9.8 10*3/uL (ref 4.0–10.5)

## 2015-06-12 LAB — URINALYSIS, ROUTINE W REFLEX MICROSCOPIC
Bilirubin Urine: NEGATIVE
Glucose, UA: NEGATIVE mg/dL
Hgb urine dipstick: NEGATIVE
Ketones, ur: NEGATIVE mg/dL
Nitrite: POSITIVE — AB
Protein, ur: 30 mg/dL — AB
Specific Gravity, Urine: 1.023 (ref 1.005–1.030)
pH: 7.5 (ref 5.0–8.0)

## 2015-06-12 LAB — URINE MICROSCOPIC-ADD ON

## 2015-06-12 MED ORDER — HYDROCHLOROTHIAZIDE 25 MG PO TABS
25.0000 mg | ORAL_TABLET | Freq: Every day | ORAL | Status: DC
  Administered 2015-06-12: 25 mg via ORAL
  Filled 2015-06-12: qty 1

## 2015-06-12 MED ORDER — LISINOPRIL 10 MG PO TABS
40.0000 mg | ORAL_TABLET | Freq: Once | ORAL | Status: AC
Start: 2015-06-12 — End: 2015-06-12
  Administered 2015-06-12: 40 mg via ORAL
  Filled 2015-06-12: qty 4

## 2015-06-12 MED ORDER — DEXTROSE 5 % IV SOLN
1.0000 g | Freq: Once | INTRAVENOUS | Status: AC
  Administered 2015-06-12: 1 g via INTRAVENOUS

## 2015-06-12 MED ORDER — DIPHENHYDRAMINE HCL 50 MG/ML IJ SOLN
25.0000 mg | Freq: Once | INTRAMUSCULAR | Status: AC
  Administered 2015-06-12: 25 mg via INTRAVENOUS
  Filled 2015-06-12: qty 1

## 2015-06-12 MED ORDER — CEFTRIAXONE SODIUM 1 G IJ SOLR
INTRAMUSCULAR | Status: AC
  Filled 2015-06-12: qty 10

## 2015-06-12 MED ORDER — METOCLOPRAMIDE HCL 5 MG/ML IJ SOLN
5.0000 mg | Freq: Once | INTRAMUSCULAR | Status: AC
  Administered 2015-06-12: 5 mg via INTRAVENOUS
  Filled 2015-06-12: qty 2

## 2015-06-12 MED ORDER — CEPHALEXIN 500 MG PO CAPS: 500.0000 mg | ORAL_CAPSULE | Freq: Two times a day (BID) | ORAL | Status: DC

## 2015-06-12 NOTE — Discharge Instructions (Signed)
Urinary Tract Infection Urinary tract infections (UTIs) can develop anywhere along your urinary tract. Your urinary tract is your body's drainage system for removing wastes and extra water. Your urinary tract includes two kidneys, two ureters, a bladder, and a urethra. Your kidneys are a pair of bean-shaped organs. Each kidney is about the size of your fist. They are located below your ribs, one on each side of your spine. CAUSES Infections are caused by microbes, which are microscopic organisms, including fungi, viruses, and bacteria. These organisms are so small that they can only be seen through a microscope. Bacteria are the microbes that most commonly cause UTIs. SYMPTOMS  Symptoms of UTIs may vary by age and gender of the patient and by the location of the infection. Symptoms in young women typically include a frequent and intense urge to urinate and a painful, burning feeling in the bladder or urethra during urination. Older women and men are more likely to be tired, shaky, and weak and have muscle aches and abdominal pain. A fever may mean the infection is in your kidneys. Other symptoms of a kidney infection include pain in your back or sides below the ribs, nausea, and vomiting. DIAGNOSIS To diagnose a UTI, your caregiver will ask you about your symptoms. Your caregiver will also ask you to provide a urine sample. The urine sample will be tested for bacteria and white blood cells. White blood cells are made by your body to help fight infection. TREATMENT  Typically, UTIs can be treated with medication. Because most UTIs are caused by a bacterial infection, they usually can be treated with the use of antibiotics. The choice of antibiotic and length of treatment depend on your symptoms and the type of bacteria causing your infection. HOME CARE INSTRUCTIONS  If you were prescribed antibiotics, take them exactly as your caregiver instructs you. Finish the medication even if you feel better after  you have only taken some of the medication.  Drink enough water and fluids to keep your urine clear or pale yellow.  Avoid caffeine, tea, and carbonated beverages. They tend to irritate your bladder.  Empty your bladder often. Avoid holding urine for long periods of time.  Empty your bladder before and after sexual intercourse.  After a bowel movement, women should cleanse from front to back. Use each tissue only once. SEEK MEDICAL CARE IF:   You have back pain.  You develop a fever.  Your symptoms do not begin to resolve within 3 days. SEEK IMMEDIATE MEDICAL CARE IF:   You have severe back pain or lower abdominal pain.  You develop chills.  You have nausea or vomiting.  You have continued burning or discomfort with urination. MAKE SURE YOU:   Understand these instructions.  Will watch your condition.  Will get help right away if you are not doing well or get worse.   This information is not intended to replace advice given to you by your health care provider. Make sure you discuss any questions you have with your health care provider.   Document Released: 03/12/2005 Document Revised: 02/21/2015 Document Reviewed: 07/11/2011 Elsevier Interactive Patient Education 2016 [employer].  Emergency Department Resource Guide 1) Find a Doctor and Pay Out of Pocket Although you won't have to find out who is covered by your insurance plan, it is a good idea to ask around and get recommendations. You will then need to call the office and see if the doctor you have chosen will accept you as a new  patient and what types of options they offer for patients who are self-pay. Some doctors offer discounts or will set up payment plans for their patients who do not have insurance, but you will need to ask so you aren't surprised when you get to your appointment.  2) Contact Your Local Health Department Not all health departments have doctors that can see patients for sick visits, but many  do, so it is worth a call to see if yours does. If you don't know where your local health department is, you can check in your phone book. The CDC also has a tool to help you locate your state's health department, and many state websites also have listings of all of their local health departments.  3) Find a [facility] If your illness is not likely to be very severe or complicated, you may want to try a walk in clinic. These are popping up all over the country in pharmacies, drugstores, and shopping centers. They're usually staffed by nurse practitioners or physician assistants that have been trained to treat common illnesses and complaints. They're usually fairly quick and inexpensive. However, if you have serious medical issues or chronic medical problems, these are probably not your best option.  No Primary Care Doctor: - Call Health Connect at  [phone] - they can help you locate a primary care doctor that  accepts your insurance, provides certain services, etc. - Physician Referral Service- [phone]  Chronic Pain Problems: Organization         Address  Phone   Notes  [facility]  [phone] Patients need to be referred by their primary care doctor.   Medication Assistance: Organization         Address  Phone   Notes  [facility] Medication [facility] [address] [place], [place] [address] [phone] --Must be a resident of [facility] -- Must have NO insurance coverage whatsoever (no Medicaid/ Medicare, etc.) -- The pt. MUST have a primary care doctor that directs their care regularly and follows them in the community   MedAssist  [phone]   [employer]  [phone]    Agencies that provide inexpensive medical care: Organization         Address  Phone   Notes  [place]  [phone]   [facility]    [phone]   [facility] [place], [place] [address] [phone]   [place] [address], [place] [phone]   Planned Parenthood    [phone]   [facility]    [phone]   [place] and [place] [address], [place] Phone:  [phone], Fax:  [phone] Hours of Operation:  9 am - 6 pm, M-F.  Also accepts Medicaid/Medicare and self-pay.  [facility] for [place], [address], [place] Phone: [phone], Fax: [phone]. Hours of Operation:  8:30 am - 5:30 pm, M-F.  Also accepts Medicaid and self-pay.  [facility] [place] [address], [place] Phone: [phone]   [place], [place], [place] [phone], Ext. 123 Mondays & Thursdays: 7-9 AM.  First 15 patients are seen on a first come, first serve basis.    New Deal Providers:  Organization         Address  Phone   Notes  [facility] [address], [place] [phone] Also accepts self-pay patients.  [facility] [id number] [place], [place]  [phone]   [place], [address], [place] [phone]   [facility] [address], [place] [phone]   Lucianne Lei [address], [place]   [phone] Only accepts [place] Access [place] patients after they have their name applied to their card.   Self-Pay (no insurance) in [facility] [place]:  Organization         Address  Phone   Notes  Sickle Cell Patients, [facility] [place] [phone]   [facility] [place] [phone]   [facility] [place]  [place], [place], [place] [phone]   [facility]/Dr. Osei-Bonsu  [address] [place] or  [address], [place] [phone] Phone number for both [place] and [place] locations is the same.  Urgent Medical and [facility] [address], [place] [phone]   [facility] [address] [place] or [address] [phone]   [facility] [address] [place] [phone], phone; [phone], fax Sees patients 1st and 3rd Saturday of every month.  Must not qualify for public or private insurance (i.e. Medicaid, Medicare, [place] Health Choice, Veterans' Benefits)  Household income should be no more than 200% of the poverty level The clinic cannot treat you if you are pregnant or think you are pregnant  Sexually transmitted diseases are not treated at the clinic.    Dental Care: Organization         Address  Phone  Notes  [facility] Department of [facility] [place] [phone] Accepts children up to age 82 who are enrolled in [place] or [place]; pregnant women with a Medicaid card; and children who have applied for Medicaid or [place] Health Choice, but were declined, whose parents can pay a reduced fee at time of service.  [facility] Department of [facility]  [address], [place] [phone] Accepts children up to age 61 who are enrolled in [place] or [place]; pregnant women with a Medicaid card; and children who have applied for Medicaid or [place] Health Choice, but were declined, whose parents can pay a reduced fee at time of service.  [place] Adult Dental Access PROGRAM  [place] [phone] Patients are seen by appointment only. Walk-ins are not accepted. [place] will see patients 33 years of age and older. Monday - Tuesday (8am-5pm) Most Wednesdays (8:30-5pm) $30 per visit, cash only  [facility] Adult Dental Access PROGRAM  [address], [facility] [phone] Patients are seen by appointment only. Walk-ins are not accepted. [place] will see patients 95 years of age and older. One Wednesday Evening (Monthly: Volunteer Based).  $30 per visit, cash only  [place]  [phone] for adults; Children under age 25, call Graduate Pediatric Dentistry at [phone]. Children aged 76-14, please call [phone] to request a pediatric application.  Dental services are provided in all areas of dental care including fillings, crowns  and bridges, complete and partial dentures, implants, gum treatment, root canals, and extractions. Preventive care is also provided. Treatment is provided to both adults and children. Patients are selected via a lottery and there is often a waiting list.   [facility] [address], [place]  [phone] [email or web address]   [facility] [address] [place], [place] [phone], Ext. [phone] Second and Fourth Thursday of each month, opens at 6:30 AM; Clinic ends at 9 AM.  Patients are seen on a first-come first-served basis, and a limited number are seen during each clinic.   [facility]  [address] Hillard Danker [place], [place] [phone]   Eligibility Requirements You must have lived in [place], [place], or [place] counties for at least the last three months.   You cannot be eligible for state or federal sponsored [employer], including [employer], [place], or [employer].   You generally cannot be eligible for healthcare insurance through your employer.    How to apply: Eligibility screenings are held every Tuesday and Wednesday afternoon from 1:00 pm until 4:00 pm. You do not need an appointment for the interview!  [facility] [address] [place], [place]   [place]  [place] Department  [place]  [phone]    Behavioral Health Resources in the Community: Intensive Outpatient Programs Organization         Address  Phone  Notes  [place]. [address] [place], [place] [phone]   [facility] Outpatient [address], [place], [place]   ADS: Alcohol & Drug Svcs [address], [place], [place]   [place] [address]  [place], [place] or [phone]   Substance Abuse Resources Organization         Address  Phone  Notes  Alcohol and Drug Services  [phone]   [place]  [phone]   [facility] [place]   Chinita Pester  [phone]   Residential & Outpatient Substance Abuse Program  [phone]   Psychological Services Organization         Address  Phone  Notes  [facility] [place]  [place]  [phone]   [place] [address] [place] or [phone]    Mobile Crisis Teams Organization         Address  Phone  Notes  [facility], Mobile Crisis Care Unit  [phone]   [facility]ive Psychotherapeutic Services  [address] [place], [place]   [place] [address] [place] [phone]    Self-Help/Support Groups Organization         Address  Phone             Notes  [place]. of [place] - variety of support groups  [place] Call for more information  Narcotics Anonymous (NA), [facility] [address], [employer] [place]  2 meetings at this location   Special educational needs teacher         Address  Phone  Notes  ASAP [facility]idential [facility],    [place]  [address], [place], [place], [place]   [place], [place] [phone]  Admissions: 8am-3pm M-F  Incentives Substance [place] [address],    [place], [place] [phone]   [facility] [address] [place], [place], [place]   [facility] [address]  [place], [place]   Insight Programs - Intensive Outpatient [address]een [address], [place], [place]   [facility] ([place].,  [place], [place] [phone] or [phone]   [facility] Treatment Services (RTS) [address], [place], [place] Accepts Medicaid  [facility] [place] [address]  [place] [phone] Substance Abuse/Addiction Treatment   [facility] Organization         Address  Phone  Notes  [facility]  [phone]   Domenic Schwab, PhD [address] Arlis Porta [place], [place]   [phone] or [phone]   Normanna Echelon [place], [place] [phone]   [facility] [address] [place], [place] [phone] Insurance/Medicaid/sponsorship through [facility] and Families [address]                                    [place], [place] [phone] [place] [address], [place] [phone]    Dr. Adele Schilder  [phone]   [facility] Dept. 1) [address] [place] 2) [place] 3)  [place] [address], [place] [phone]  [phone]   [place] [phone] or [phone] (After Hours)

## 2015-06-12 NOTE — ED Notes (Signed)
Pt c/o n/d  And generalized weakness x 2 weeks

## 2015-06-12 NOTE — ED Provider Notes (Signed)
CSN: [id number]     Arrival date & time 06/12/15  1303 History   First MD Initiated Contact with Patient 06/12/15 1415     Chief Complaint  Patient presents with  . Diarrhea     (Consider location/radiation/quality/duration/timing/severity/associated sxs/prior Treatment) HPI  Roberta Young is a 44 y.o. female with PMH significant for HTN, fibroids, depression, anemia (transfusion 2005) who presents with 2 week history of constant, moderate, worsening generalized weakness.  Patient reports that she had a cold 3 weeks ago and felt like she was improving, but this morning had 4 episodes of nonbloody watery diarrhea.  Associated symptoms include nausea, resolved abdominal pain, myalgias, cough, and nasal congestion.  No bloody stools, fever, CP, SOB, urinary symptoms, sore throat, neck stiffness/pain, visual disturbances, slurred speech, or facial droop.   She reports decreased PO intake, and states "i just feel dehydrated".  Denies recent travel or abx use.  Endorses sick contacts.  She reports she is not taking her BP medications because she does not like the way they make her feel.  Past Medical History  Diagnosis Date  . Hypertension   . Fibroids   . SVD (spontaneous vaginal delivery)     x 2  . Depression   . Anemia   . [facility]  . Acute [facility] infection    Past Surgical History  Procedure Laterality Date  . Tubal ligation    . Ablation    . Blood transfusion  2007    ? 4 units transfused at [facility]  . Cesarean section      x 1  . Bilateral salpingectomy Bilateral 05/25/2013    Procedure: BILATERAL SALPINGECTOMY;  Surgeon: Woodroe Mode, MD;  Location: [facility];  Service: Gynecology;  Laterality: Bilateral;  . Abdominal hysterectomy N/A 05/25/2013    Procedure: HYSTERECTOMY ABDOMINAL;  Surgeon: Woodroe Mode, MD;  Location: [facility];  Service: Gynecology;  Laterality: N/A;   Family History  Problem Relation Age  of Onset  . Hypertension Mother   . Hypertension Father   . Diabetes Father   . Hypertension Sister   . Kidney disease Maternal Aunt   . Heart disease Maternal Grandmother   . Heart disease Maternal Grandfather    Social History  Substance Use Topics  . Smoking status: Current Every Day Smoker -- 0.25 packs/day for 27 years    Types: Cigarettes  . Smokeless tobacco: Never Used  . Alcohol Use: Yes     Comment: socially   OB History    Gravida Para Term Preterm AB TAB SAB Ectopic Multiple Living   3 3 3       3      Review of Systems All other systems negative unless otherwise stated in HPI    Allergies  Review of patient's allergies indicates no known allergies.  Home Medications   Prior to Admission medications   Medication Sig Start Date End Date Taking? Authorizing Provider  amoxicillin (AMOXIL) 500 MG capsule Take 500 mg by mouth 3 (three) times daily.    Historical Provider, MD  antipyrine-benzocaine Toniann Fail) otic solution Place 2-3 drops into both ears every 2 (two) hours as needed for ear pain.    Historical Provider, MD  estradiol (ESTRACE) 1 MG tablet Take 1 tablet (1 mg total) by mouth daily. 05/27/13   Woodroe Mode, MD  ferrous sulfate 325 (65 FE) MG tablet Take 1 tablet (325 mg total) by mouth daily  with breakfast. 03/31/13   Woodroe Mode, MD  hydrochlorothiazide (HYDRODIURIL) 25 MG tablet Take 25 mg by mouth daily.    Historical Provider, MD  lisinopril (PRINIVIL,ZESTRIL) 40 MG tablet Take 40 mg by mouth daily.    Historical Provider, MD  neomycin-polymyxin-hydrocortisone (CORTISPORIN) otic solution Place 3-4 drops into both ears 4 (four) times daily.    Historical Provider, MD  oxyCODONE-acetaminophen (PERCOCET/ROXICET) 5-325 MG per tablet Take 1-2 tablets by mouth every 4 (four) hours as needed for severe pain (moderate to severe pain (when tolerating fluids)). 05/27/13   Woodroe Mode, MD   BP 189/121 mmHg  Pulse 80  Temp(Src) 98.2 F (36.8 C)  Resp  18  Ht 5\' 6"  (1.676 m)  Wt 58.514 kg  BMI 20.83 kg/m2  SpO2 99%  LMP 03/16/2013 Physical Exam  Constitutional: She is oriented to person, place, and time. She appears well-developed and well-nourished.  HENT:  Head: Normocephalic and atraumatic.  Right Ear: Tympanic membrane and external ear normal.  Left Ear: Tympanic membrane and external ear normal.  Nose: Nose normal. Right sinus exhibits no maxillary sinus tenderness and no frontal sinus tenderness. Left sinus exhibits no maxillary sinus tenderness and no frontal sinus tenderness.  Mouth/Throat: Uvula is midline and oropharynx is clear and moist. No oropharyngeal exudate, posterior oropharyngeal edema, posterior oropharyngeal erythema or tonsillar abscesses.  Eyes: Conjunctivae are normal. Pupils are equal, round, and reactive to light.  Neck: Normal range of motion. Neck supple.  No nuchal rigidity.   Cardiovascular: Normal rate, regular rhythm and normal heart sounds.   No murmur heard. Pulmonary/Chest: Effort normal and breath sounds normal. No accessory muscle usage or stridor. No respiratory distress. She has no wheezes. She has no rhonchi. She has no rales.  Abdominal: Soft. Bowel sounds are normal. She exhibits no distension. There is no tenderness. There is no CVA tenderness.  Musculoskeletal: Normal range of motion. She exhibits no tenderness.  Lymphadenopathy:    She has no cervical adenopathy.  Neurological: She is alert and oriented to person, place, and time.  Speech clear without dysarthria.  Cranial nerves grossly intact.  No pronator drift.  RAMs intact bilaterally.  Strength and sensation intact bilaterally throughout upper and lower extremities.  Skin: Skin is warm and dry. No rash noted.  Psychiatric: She has a normal mood and affect. Her behavior is normal.    ED Course  Procedures (including critical care time) Labs Review Labs Reviewed  URINALYSIS, ROUTINE W REFLEX MICROSCOPIC (NOT AT [facility]) - Abnormal;  Notable for the following:    APPearance CLOUDY (*)    Protein, ur 30 (*)    Nitrite POSITIVE (*)    Leukocytes, UA MODERATE (*)    All other components within normal limits  CBC WITH DIFFERENTIAL/PLATELET - Abnormal; Notable for the following:    Neutro Abs 7.8 (*)    Monocytes Absolute 1.1 (*)    All other components within normal limits  URINE MICROSCOPIC-ADD ON - Abnormal; Notable for the following:    Squamous Epithelial / LPF 0-5 (*)    Bacteria, UA MANY (*)    All other components within normal limits  URINE CULTURE  COMPREHENSIVE METABOLIC PANEL    Imaging Review Dg Chest 2 View  06/12/2015  CLINICAL DATA:  Cough and generalized weakness for 2 weeks EXAM: CHEST  2 VIEW COMPARISON:  March 26, 2014 FINDINGS: There is no edema or consolidation. The heart size and pulmonary vascularity are normal. No adenopathy. No bone lesions. IMPRESSION: No  edema or consolidation. Electronically Signed   By: Lowella Grip III M.D.   On: 06/12/2015 15:16   I have personally reviewed and evaluated these images and lab results as part of my medical decision-making.   EKG Interpretation None      MDM   Final diagnoses:  UTI (lower urinary tract infection)    Patient presents with multiple complaints for the past couple of weeks including nausea, diarrhea, generalized weakness, headache.  No fever, CP, SOB, or abdominal pain.  No CVA tenderness.  Doubt pyelonephritis. Blood pressure 154/120, pulse 88, temperature 98.2 F (36.8 C), resp. rate 18, height 5\' 6"  (1.676 m), weight 58.514 kg, last menstrual period 03/16/2013, SpO2 98 %.  On exam, heart RRR, lungs CTAB, abdomen soft and benign.  No focal neurological deficits.  Doubt CVA, SAH, NPH, intracranial lesion.  Will obtain labs and give fluids.  Discussed importance of BP management and adherence to medication regimen.  CBC unremarkable, WBC 9.8, HGB 14.3 CMP unremarkable UA shows positive nitrite and moderate leukocytes.  Will give  1g rocephin and d/c home with keflex. Upon reassessment, patient's symptoms improved. Evaluation does not show pathology requring ongoing emergent intervention or admission. Pt is hemodynamically stable and mentating appropriately. Discussed findings/results and plan with patient/guardian, who agrees with plan. All questions answered. Return precautions discussed and outpatient follow up given.  Case has been discussed with  Dr. Wilson Singer who agrees with the above plan for discharge.        Gloriann Loan, PA-C 06/12/15 [place], MD 06/13/15 [phone]

## 2015-06-14 LAB — URINE CULTURE: Culture: >=100000

## 2015-06-15 ENCOUNTER — Telehealth (HOSPITAL_COMMUNITY): Payer: Self-pay

## 2015-06-15 NOTE — Telephone Encounter (Signed)
Post ED Visit - Positive Culture Follow-up  Culture report reviewed by antimicrobial stewardship pharmacist:  []  Elenor Quinones, Pharm.D. []  Heide Guile, Pharm.D., BCPS [x]  Parks Neptune, Pharm.D. []  Alycia Rossetti, Pharm.D., BCPS []  [place], Pharm.D., BCPS, AAHIVP []  Legrand Como, Pharm.D., BCPS, AAHIVP []  Milus Glazier, Pharm.D. []  Stephens November, Pharm.D.  Positive urine culture, >/= 100,000 colonies -> E Coli Treated with Cephalexin, organism sensitive to the same and no further patient follow-up is required at this time.  Dortha Kern 06/15/2015, 8:52 AM

## 2015-08-21 ENCOUNTER — Emergency Department (HOSPITAL_BASED_OUTPATIENT_CLINIC_OR_DEPARTMENT_OTHER): Payer: Self-pay

## 2015-08-21 ENCOUNTER — Emergency Department (HOSPITAL_BASED_OUTPATIENT_CLINIC_OR_DEPARTMENT_OTHER)
Admission: EM | Admit: 2015-08-21 | Discharge: 2015-08-21 | Disposition: A | Discharge status: Home / Self Care | Payer: Self-pay | Attending: Emergency Medicine | Admitting: Emergency Medicine

## 2015-08-21 ENCOUNTER — Encounter (HOSPITAL_BASED_OUTPATIENT_CLINIC_OR_DEPARTMENT_OTHER): Payer: Self-pay | Admitting: Emergency Medicine

## 2015-08-21 DIAGNOSIS — Z792 Long term (current) use of antibiotics: Secondary | ICD-10-CM | POA: Insufficient documentation

## 2015-08-21 DIAGNOSIS — D649 Anemia, unspecified: Secondary | ICD-10-CM | POA: Insufficient documentation

## 2015-08-21 DIAGNOSIS — Z8669 Personal history of other diseases of the nervous system and sense organs: Secondary | ICD-10-CM | POA: Insufficient documentation

## 2015-08-21 DIAGNOSIS — R0609 Other forms of dyspnea: Secondary | ICD-10-CM

## 2015-08-21 DIAGNOSIS — I10 Essential (primary) hypertension: Secondary | ICD-10-CM | POA: Insufficient documentation

## 2015-08-21 DIAGNOSIS — Z79899 Other long term (current) drug therapy: Secondary | ICD-10-CM | POA: Insufficient documentation

## 2015-08-21 DIAGNOSIS — J069 Acute upper respiratory infection, unspecified: Secondary | ICD-10-CM | POA: Insufficient documentation

## 2015-08-21 DIAGNOSIS — Z79818 Long term (current) use of other agents affecting estrogen receptors and estrogen levels: Secondary | ICD-10-CM | POA: Insufficient documentation

## 2015-08-21 DIAGNOSIS — Z8659 Personal history of other mental and behavioral disorders: Secondary | ICD-10-CM | POA: Insufficient documentation

## 2015-08-21 DIAGNOSIS — Z86018 Personal history of other benign neoplasm: Secondary | ICD-10-CM | POA: Insufficient documentation

## 2015-08-21 DIAGNOSIS — B9789 Other viral agents as the cause of diseases classified elsewhere: Secondary | ICD-10-CM

## 2015-08-21 DIAGNOSIS — F17210 Nicotine dependence, cigarettes, uncomplicated: Secondary | ICD-10-CM | POA: Insufficient documentation

## 2015-08-21 LAB — CBC WITH DIFFERENTIAL/PLATELET
BASOS PCT: 0 %
Basophils Absolute: 0 10*3/uL (ref 0.0–0.1)
EOS PCT: 0 %
Eosinophils Absolute: 0 10*3/uL (ref 0.0–0.7)
HEMATOCRIT: 43.8 % (ref 36.0–46.0)
HEMOGLOBIN: 15.1 g/dL — AB (ref 12.0–15.0)
LYMPHS PCT: 26 %
Lymphs Abs: 1 10*3/uL (ref 0.7–4.0)
MCH: 29.5 pg (ref 26.0–34.0)
MCHC: 34.5 g/dL (ref 30.0–36.0)
MCV: 85.5 fL (ref 78.0–100.0)
MONOS PCT: 10 %
Monocytes Absolute: 0.4 10*3/uL (ref 0.1–1.0)
NEUTROS ABS: 2.6 10*3/uL (ref 1.7–7.7)
Neutrophils Relative %: 64 %
Platelets: 128 10*3/uL — ABNORMAL LOW (ref 150–400)
RBC: 5.12 MIL/uL — ABNORMAL HIGH (ref 3.87–5.11)
RDW: 12.7 % (ref 11.5–15.5)
WBC: 4 10*3/uL (ref 4.0–10.5)

## 2015-08-21 LAB — BASIC METABOLIC PANEL
Anion gap: 7 (ref 5–15)
BUN: 12 mg/dL (ref 6–20)
CHLORIDE: 99 mmol/L — AB (ref 101–111)
CO2: 30 mmol/L (ref 22–32)
Calcium: 8.2 mg/dL — ABNORMAL LOW (ref 8.9–10.3)
Creatinine, Ser: 0.95 mg/dL (ref 0.44–1.00)
GFR calc Af Amer: >60 mL/min (ref >60)
GFR calc non Af Amer: >60 mL/min (ref >60)
GLUCOSE: 108 mg/dL — AB (ref 65–99)
POTASSIUM: 2.9 mmol/L — AB (ref 3.5–5.1)
SODIUM: 136 mmol/L (ref 135–145)

## 2015-08-21 LAB — TROPONIN I

## 2015-08-21 NOTE — Discharge Instructions (Signed)
We saw you in the ER for the chest pain/shortness of breath. All of our cardiac workup is normal, including labs, EKG and chest X-RAY are normal. We are not sure what is causing your discomfort, but we feel comfortable sending you home at this time. The workup in the ER is not complete, and you should follow up with your primary care doctor for further evaluation.  Please return to the ER if you have worsening chest pain, shortness of breath, pain radiating to your jaw, shoulder, or back, sweats or fainting.   Shortness of Breath Shortness of breath means you have trouble breathing. It could also mean that you have a medical problem. You should get immediate medical care for shortness of breath. CAUSES   Not enough oxygen in the air such as with high altitudes or a smoke-filled room.  Certain lung diseases, infections, or problems.  Heart disease or conditions, such as angina or heart failure.  Low red blood cells (anemia).  Poor physical fitness, which can cause shortness of breath when you exercise.  Chest or back injuries or stiffness.  Being overweight.  Smoking.  Anxiety, which can make you feel like you are not getting enough air. DIAGNOSIS  Serious medical problems can often be found during your physical exam. Tests may also be done to determine why you are having shortness of breath. Tests may include:  Chest X-rays.  Lung function tests.  Blood tests.  An electrocardiogram (ECG).  An ambulatory electrocardiogram. An ambulatory ECG records your heartbeat patterns over a 24-hour period.  Exercise testing.  A transthoracic echocardiogram (TTE). During echocardiography, sound waves are used to evaluate how blood flows through your heart.  A transesophageal echocardiogram (TEE).  Imaging scans. Your health care provider may not be able to find a cause for your shortness of breath after your exam. In this case, it is important to have a follow-up exam with your  health care provider as directed.  TREATMENT  Treatment for shortness of breath depends on the cause of your symptoms and can vary greatly. HOME CARE INSTRUCTIONS   Do not smoke. Smoking is a common cause of shortness of breath. If you smoke, ask for help to quit.  Avoid being around chemicals or things that may bother your breathing, such as paint fumes and dust.  Rest as needed. Slowly resume your usual activities.  If medicines were prescribed, take them as directed for the full length of time directed. This includes oxygen and any inhaled medicines.  Keep all follow-up appointments as directed by your health care provider. SEEK MEDICAL CARE IF:   Your condition does not improve in the time expected.  You have a hard time doing your normal activities even with rest.  You have any new symptoms. SEEK IMMEDIATE MEDICAL CARE IF:   Your shortness of breath gets worse.  You feel light-headed, faint, or develop a cough not controlled with medicines.  You start coughing up blood.  You have pain with breathing.  You have chest pain or pain in your arms, shoulders, or abdomen.  You have a fever.  You are unable to walk up stairs or exercise the way you normally do. MAKE SURE YOU:  Understand these instructions.  Will watch your condition.  Will get help right away if you are not doing well or get worse.   This information is not intended to replace advice given to you by your health care provider. Make sure you discuss any questions you have with  your health care provider.   Document Released: 02/25/2001 Document Revised: 06/07/2013 Document Reviewed: 08/18/2011 Elsevier Interactive Patient Education [employer].

## 2015-08-21 NOTE — ED Provider Notes (Signed)
CSN: [id number]     Arrival date & time 08/21/15  1043 History   First MD Initiated Contact with Patient 08/21/15 1049     Chief Complaint  Patient presents with  . Shortness of Breath     (Consider location/radiation/quality/duration/timing/severity/associated sxs/prior Treatment) HPI Comments: Pt comes in with cc of cough and dib/weakness. Cough x 3 days. Pt has yellow phlegm. + sick contacts with multiple people who had cold in her house. Subjective fevers and weakness, no chills. Pt has chest tightness with cough. No hx of asthma.  Pt also has been having weakness and dib with exertion x 3 days. She has had transfusion in the past, and was having dib with exertion at that time. Pt gets short of breath with 20 yards, and her legs feel tired. No bloody stools, tarry stools no vaginal bleeding.  Pt has no hx of PE, DVT and denies any exogenous estrogen use, long distance travels or surgery in the past 6 weeks, active cancer, recent immobilization. Pt stpped her estradiol 6 months ago.  Pt has no lung disease, CAD. + smoking hx.   ROS 10 Systems reviewed and are negative for acute change except as noted in the HPI.        Patient is a 45 y.o. female presenting with shortness of breath. The history is provided by the patient.  Shortness of Breath   Past Medical History  Diagnosis Date  . Hypertension   . Fibroids   . SVD (spontaneous vaginal delivery)     x 2  . Depression   . Anemia   [facility]  . Acute ear infection    Past Surgical History  Procedure Laterality Date  . Tubal ligation    . Ablation    . Blood transfusion  2007    ? 4 units [facility]used at [facility]  . Cesarean section      x 1  . Bilateral salpingectomy Bilateral 05/25/2013    Procedure: BILATERAL SALPINGECTOMY;  Surgeon: Woodroe Mode, MD;  Location: [facility];  Service: Gynecology;  Laterality: Bilateral;  . Abdominal hysterectomy  N/A 05/25/2013    Procedure: HYSTERECTOMY ABDOMINAL;  Surgeon: Woodroe Mode, MD;  Location: [facility];  Service: Gynecology;  Laterality: N/A;   Family History  Problem Relation Age of Onset  . Hypertension Mother   . Hypertension Father   . Diabetes Father   . Hypertension Sister   . Kidney disease Maternal Aunt   . Heart disease Maternal Grandmother   . Heart disease Maternal Grandfather    Social History  Substance Use Topics  . Smoking status: Current Every Day Smoker -- 0.25 packs/day for 27 years    Types: Cigarettes  . Smokeless tobacco: Never Used  . Alcohol Use: Yes     Comment: socially   OB History    Gravida Para Term Preterm AB TAB SAB Ectopic Multiple Living   3 3 3       3      Review of Systems  Respiratory: Positive for shortness of breath.       Allergies  Review of patient's allergies indicates no known allergies.  Home Medications   Prior to Admission medications   Medication Sig Start Date End Date Taking? Authorizing Provider  amoxicillin (AMOXIL) 500 MG capsule Take 500 mg by mouth 3 (three) times daily.    Historical Provider, MD  antipyrine-benzocaine Toniann Fail) otic solution Place 2-3 drops  into both ears every 2 (two) hours as needed for ear pain.    Historical Provider, MD  cephALEXin (KEFLEX) 500 MG capsule Take 1 capsule (500 mg total) by mouth 2 (two) times daily. 06/12/15   Gloriann Loan, PA-C  estradiol (ESTRACE) 1 MG tablet Take 1 tablet (1 mg total) by mouth daily. 05/27/13   Woodroe Mode, MD  ferrous sulfate 325 (65 FE) MG tablet Take 1 tablet (325 mg total) by mouth daily with breakfast. 03/31/13   Woodroe Mode, MD  hydrochlorothiazide (HYDRODIURIL) 25 MG tablet Take 25 mg by mouth daily.    Historical Provider, MD  lisinopril (PRINIVIL,ZESTRIL) 40 MG tablet Take 40 mg by mouth daily.    Historical Provider, MD  neomycin-polymyxin-hydrocortisone (CORTISPORIN) otic solution Place 3-4 drops into both ears 4 (four) times daily.     Historical Provider, MD  oxyCODONE-acetaminophen (PERCOCET/ROXICET) 5-325 MG per tablet Take 1-2 tablets by mouth every 4 (four) hours as needed for severe pain (moderate to severe pain (when tolerating fluids)). 05/27/13   Woodroe Mode, MD   BP 147/119 mmHg  Pulse 85  Temp(Src) 98.9 F (37.2 C) (Oral)  Resp 20  Wt 129 lb (58.514 kg)  SpO2 98%  LMP 03/16/2013 Physical Exam  Constitutional: She is oriented to person, place, and time. She appears well-developed.  HENT:  Head: Normocephalic and atraumatic.  Eyes: Conjunctivae and EOM are normal. Pupils are equal, round, and reactive to light.  Neck: Normal range of motion. Neck supple.  Cardiovascular: Normal rate, regular rhythm and normal heart sounds.   Pulmonary/Chest: Effort normal and breath sounds normal. No respiratory distress.  Abdominal: Soft. Bowel sounds are normal. She exhibits no distension. There is no tenderness. There is no rebound and no guarding.  Musculoskeletal: She exhibits no edema or tenderness.  Neurological: She is alert and oriented to person, place, and time.  Skin: Skin is warm and dry.    ED Course  Procedures (including critical care time) Labs Review Labs Reviewed  CBC WITH DIFFERENTIAL/PLATELET - Abnormal; Notable for the following:    RBC 5.12 (*)    Hemoglobin 15.1 (*)    Platelets 128 (*)    All other components within normal limits  BASIC METABOLIC PANEL - Abnormal; Notable for the following:    Potassium 2.9 (*)    Chloride 99 (*)    Glucose, Bld 108 (*)    Calcium 8.2 (*)    All other components within normal limits  TROPONIN I    Imaging Review Dg Chest 2 View  08/21/2015  CLINICAL DATA:  Cough with shortness of breath for 4 days. Bilateral leg weakness. EXAM: CHEST  2 VIEW COMPARISON:  08/17/2015 and 06/12/2015 radiographs. FINDINGS: The heart size and mediastinal contours are normal. The lungs are clear. There is no pleural effusion or pneumothorax. No acute osseous findings are  identified. IMPRESSION: Stable chest.  No active cardiopulmonary process. Electronically Signed   By: Richardean Sale M.D.   On: 08/21/2015 11:27   I have personally reviewed and evaluated these images and lab results as part of my medical decision-making.   EKG Interpretation   Date/Time:  Tuesday August 21 2015 10:55:15 EST Ventricular Rate:  89 PR Interval:  146 QRS Duration: 89 QT Interval:  373 QTC Calculation: 454 R Axis:   37 Text Interpretation:  Sinus rhythm Probable left atrial enlargement  Probable left ventricular hypertrophy Borderline T abnormalities, lateral  leads t wave abnormality is new Confirmed by Kathrynn Humble, MD, Thelma Comp (  [id number])  on 08/21/2015 11:14:47 AM      MDM   Final diagnoses:  Exertional dyspnea  Viral URI with cough    Pt comes in with cc of cough, uri like symptoms and dib. She has a productive cough, malaise, uri like symptoms and people in her house who have similar symptoms - all of this is likely viral uri.  She also reports exertional dyspnea, no chest pain. Whilst in the room, her RR, HR are WNL, she is PERC neg. PE considered in the ddx due to previous use of exogenous estrogen. We will get basic labs and reassess. Ambulatory pulsox ordered.  @2 :00: Pt ambulated in the ER with normal O2 sat and HR in the 100-110. I went and discussed with her the xray findings and the normal Hb. We discussed the possibility of PE - less likely overall, but still possible. We discussed the diagnostic pathway, with dimer and if needed CT or return to the ER if symptoms get worse for further diagnostic workup. Pt prefers the latter. She actually doesn't want to wait for her 2nd trop draw either. We discussed the ekg and normal trop as well. Will d.c. Strict ER returnrn precautions discussed, and pt agrees with the plan.   Varney Biles, MD 08/21/15 1438

## 2015-08-21 NOTE — ED Notes (Signed)
Ambulated in hall r/a Spo2 99-100%, HR 100-110, RR 20-24, -DOE, complaint of leg fatigue.

## 2015-08-21 NOTE — ED Notes (Addendum)
Patient states that she has had a course cough x 3 days with extreme SOB. The patient reports that she gets out of breath with any activity. Patient reports that she is having having lower leg weakness from her knees to her feet

## 2016-05-02 IMAGING — CR DG CHEST 2V
2 series · 2 of 2 positions shown · non-contrast
Comparison: March 26, 2014

CLINICAL DATA: Cough and generalized weakness for 2 weeks

EXAM:
CHEST  2 VIEW

[w chest pa]
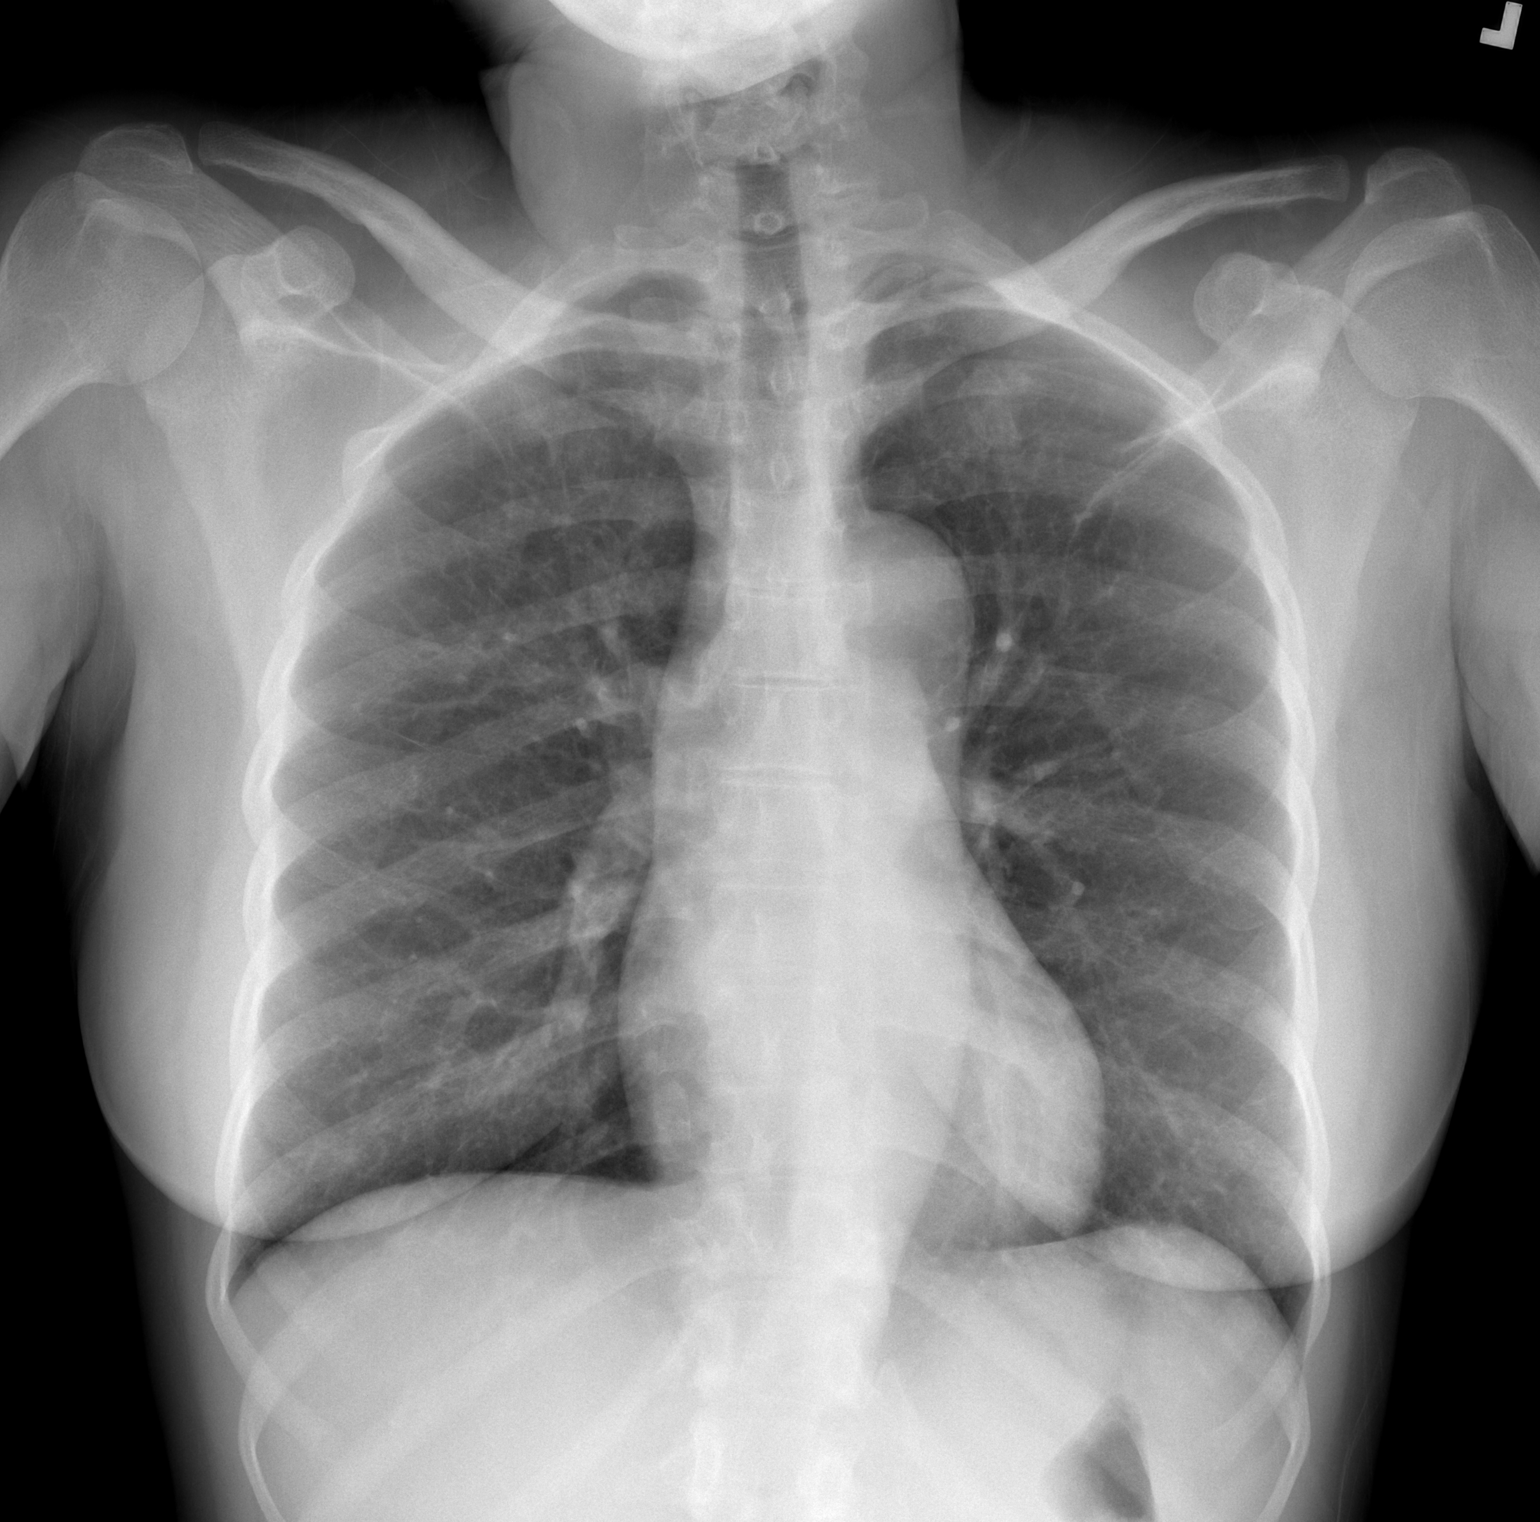

[w chest lat]
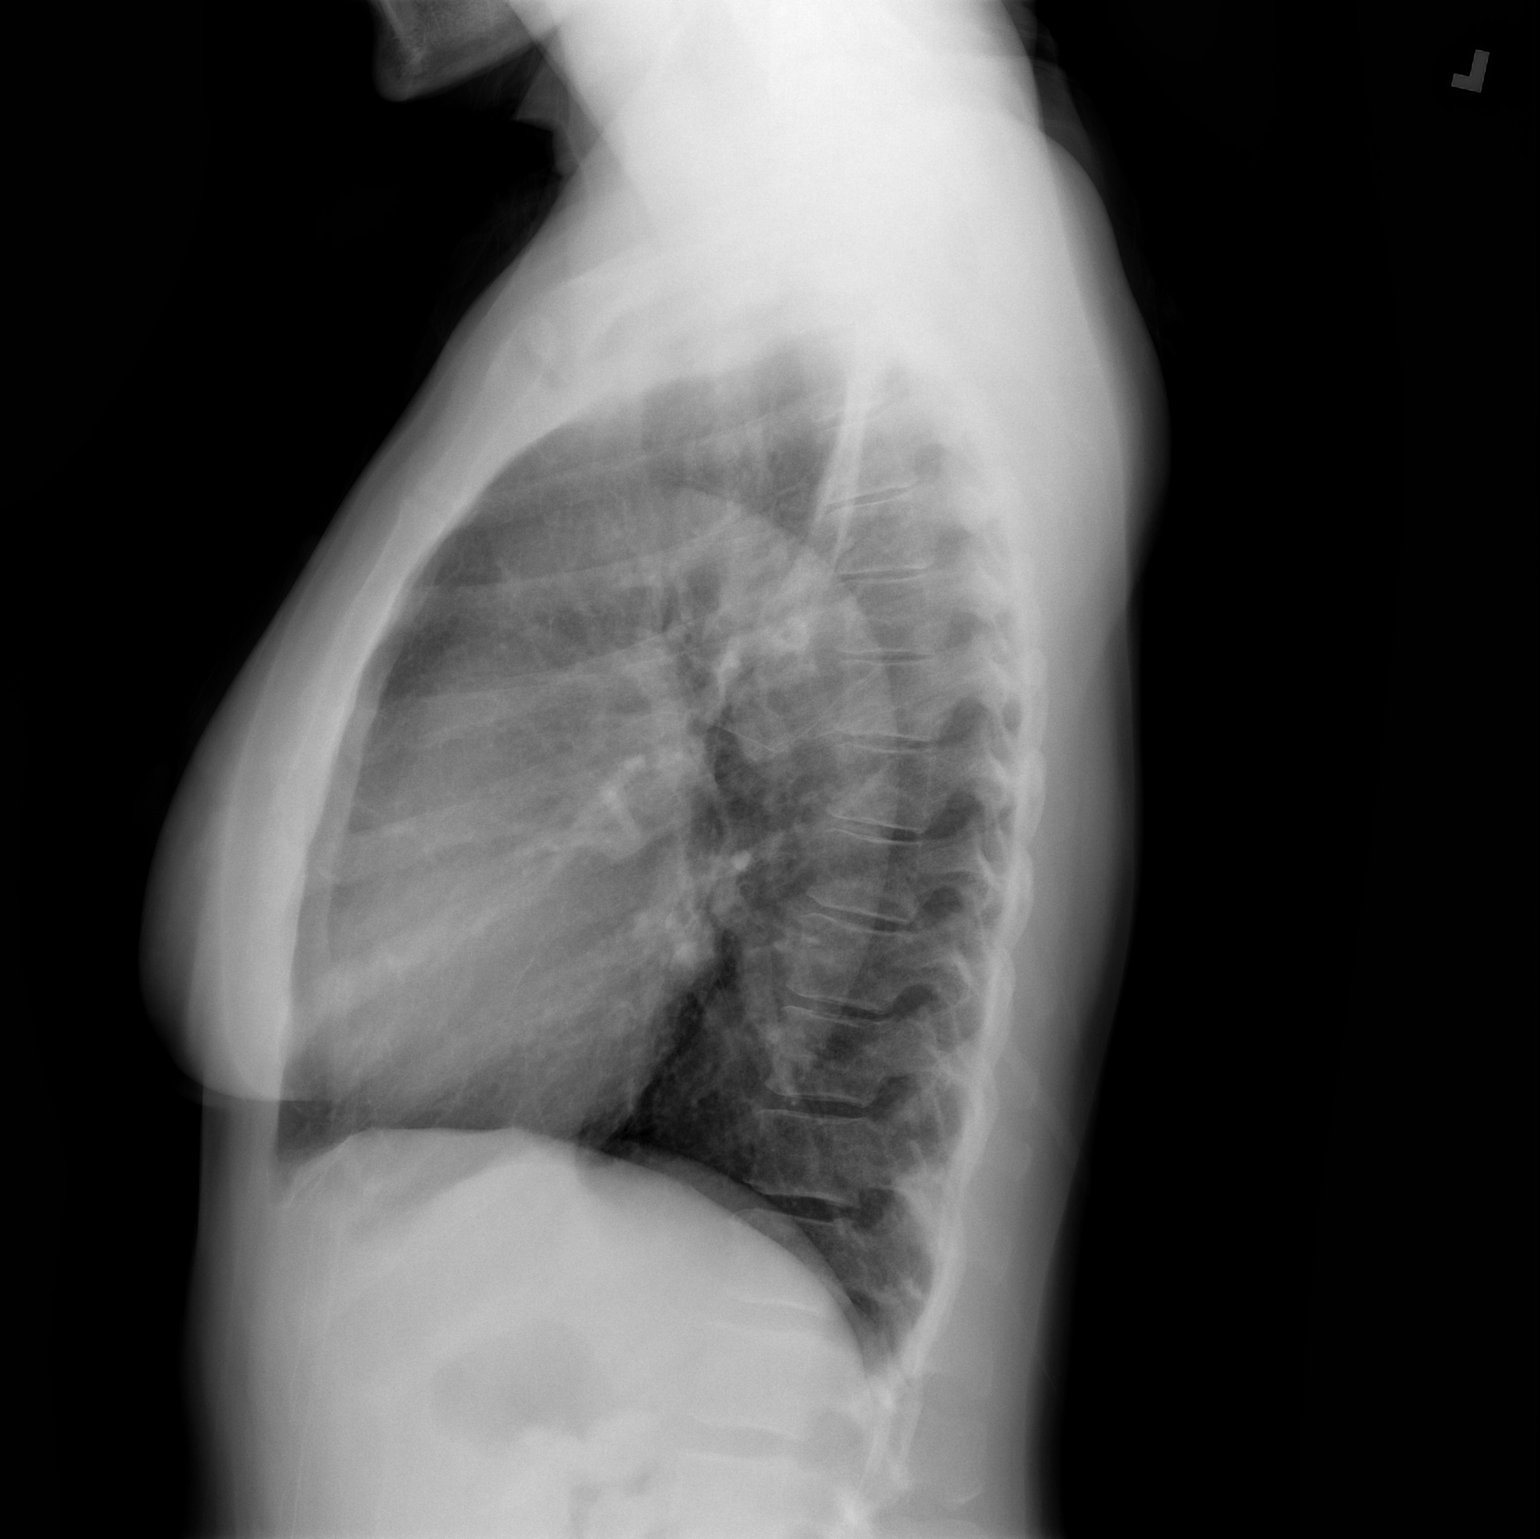

[2 of 2 positions shown; findings below may reference images not displayed]

FINDINGS: There is no edema or consolidation. The heart size and pulmonary
vascularity are normal. No adenopathy. No bone lesions.
IMPRESSION: No edema or consolidation.

## 2016-07-11 IMAGING — CR DG CHEST 2V
2 series · 2 of 2 positions shown · non-contrast
Comparison: 08/17/2015 and 06/12/2015 radiographs.

CLINICAL DATA: Cough with shortness of breath for 4 days. Bilateral
leg weakness.

EXAM:
CHEST  2 VIEW

[w chest pa]
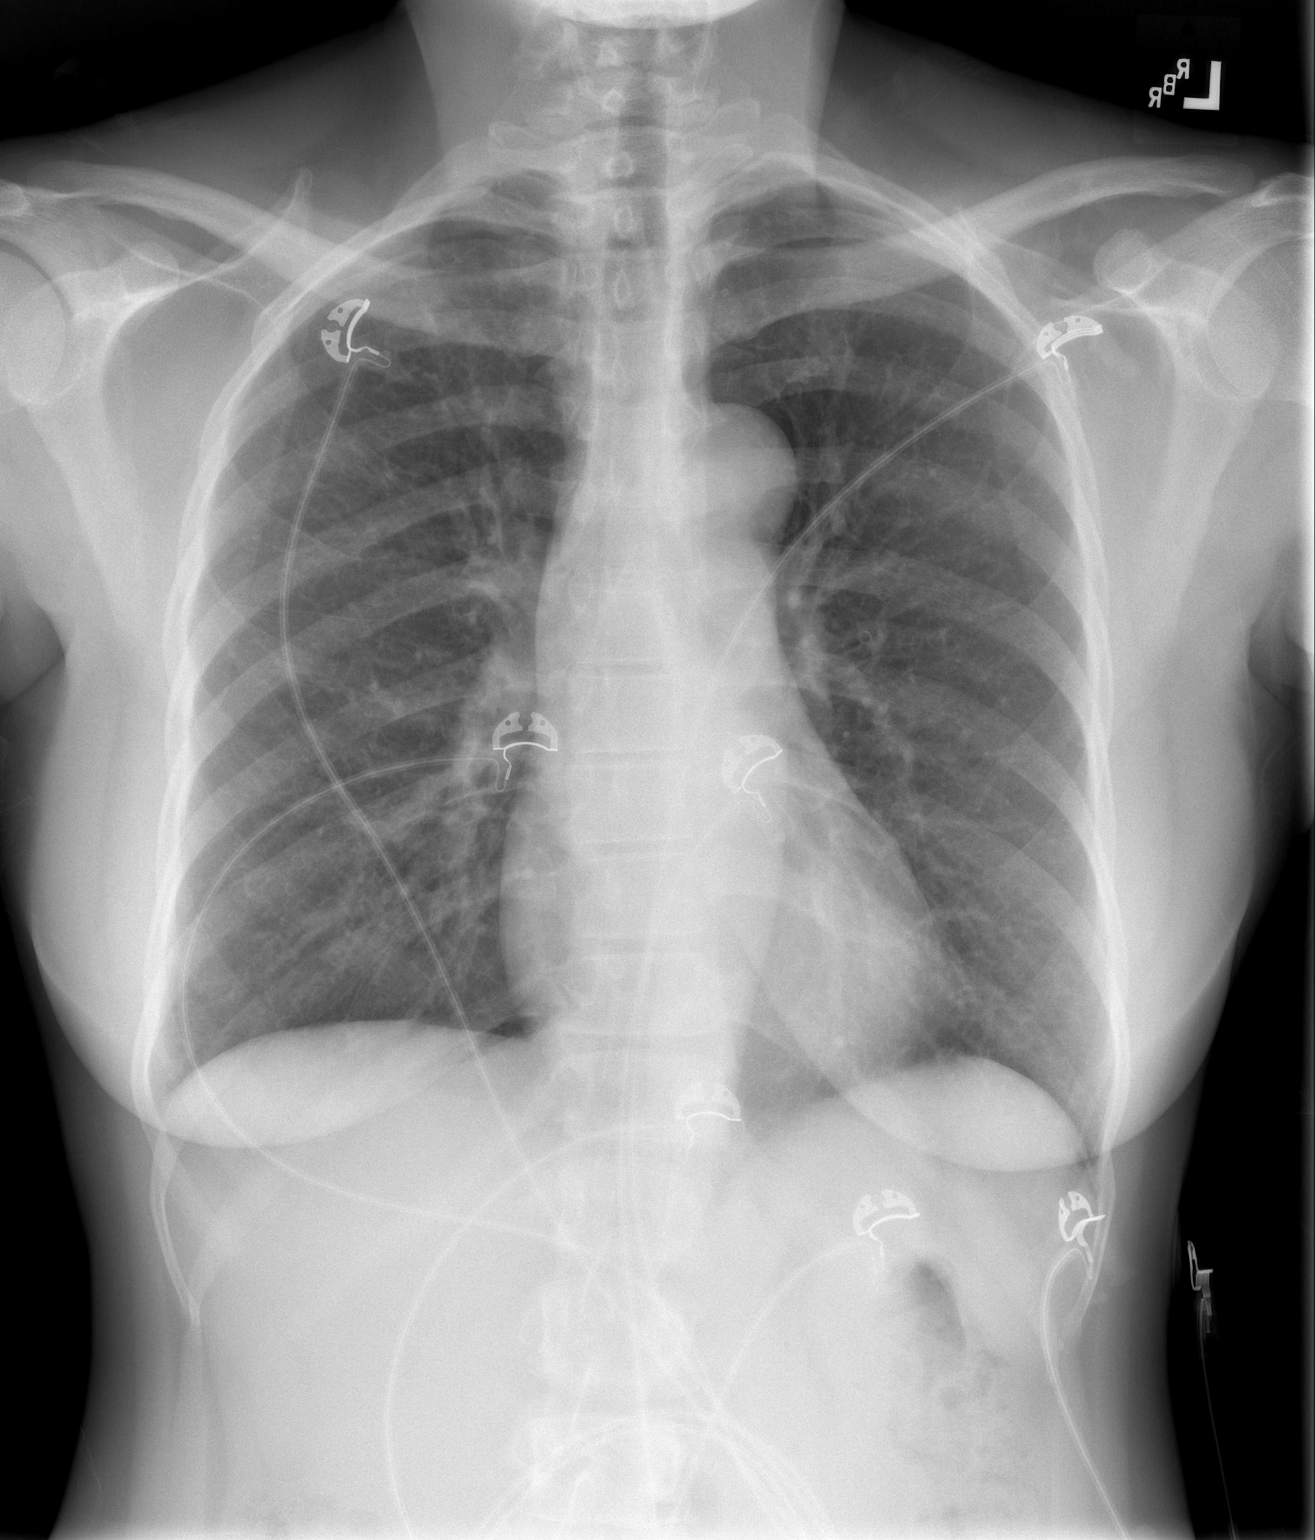

[w chest lat]
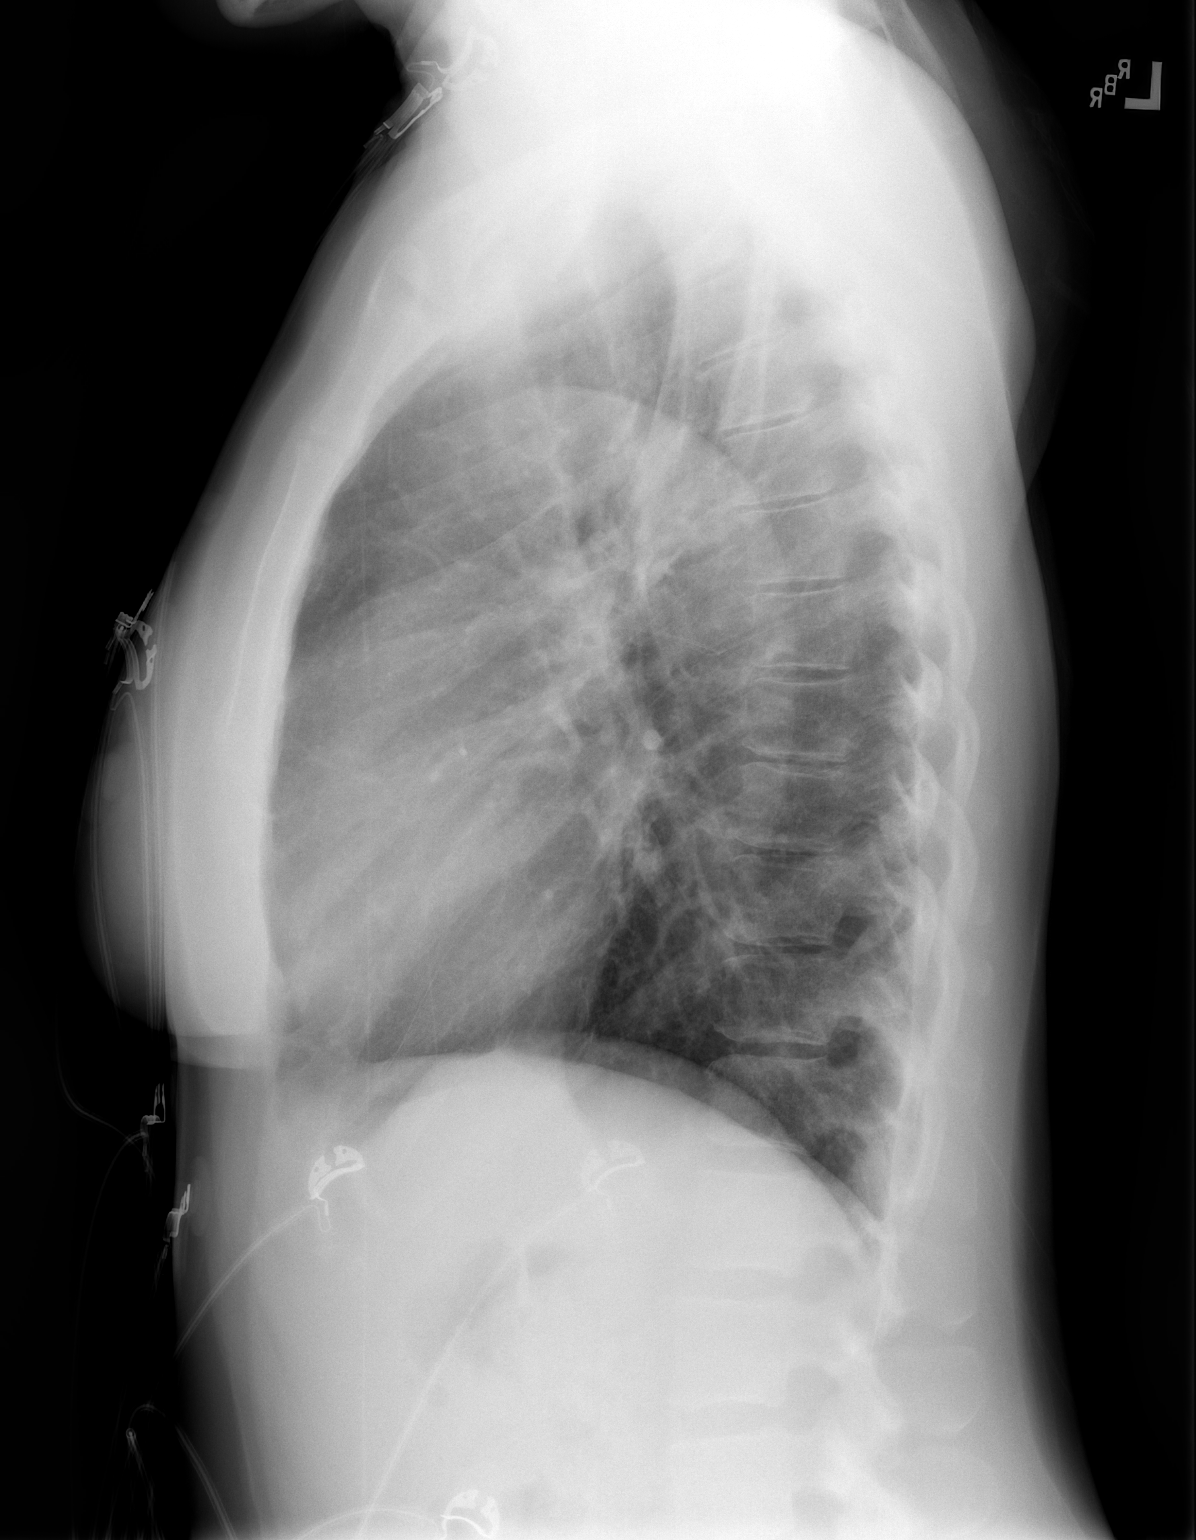

[2 of 2 positions shown; findings below may reference images not displayed]

FINDINGS: The heart size and mediastinal contours are normal. The lungs are
clear. There is no pleural effusion or pneumothorax. No acute
osseous findings are identified.
IMPRESSION: Stable chest.  No active cardiopulmonary process.

## 2018-05-30 ENCOUNTER — Emergency Department (HOSPITAL_BASED_OUTPATIENT_CLINIC_OR_DEPARTMENT_OTHER)
Admission: EM | Admit: 2018-05-30 | Discharge: 2018-05-30 | Disposition: A | Discharge status: Home / Self Care | Payer: Self-pay | Attending: Emergency Medicine | Admitting: Emergency Medicine

## 2018-05-30 ENCOUNTER — Other Ambulatory Visit: Payer: Self-pay

## 2018-05-30 ENCOUNTER — Encounter (HOSPITAL_BASED_OUTPATIENT_CLINIC_OR_DEPARTMENT_OTHER): Payer: Self-pay | Admitting: Emergency Medicine

## 2018-05-30 DIAGNOSIS — Z79899 Other long term (current) drug therapy: Secondary | ICD-10-CM | POA: Insufficient documentation

## 2018-05-30 DIAGNOSIS — M5432 Sciatica, left side: Secondary | ICD-10-CM | POA: Insufficient documentation

## 2018-05-30 DIAGNOSIS — I10 Essential (primary) hypertension: Secondary | ICD-10-CM | POA: Insufficient documentation

## 2018-05-30 DIAGNOSIS — F17210 Nicotine dependence, cigarettes, uncomplicated: Secondary | ICD-10-CM | POA: Insufficient documentation

## 2018-05-30 MED ORDER — AMLODIPINE BESYLATE 5 MG PO TABS
10.0000 mg | ORAL_TABLET | Freq: Once | ORAL | Status: AC
  Administered 2018-05-30: 10 mg via ORAL
  Filled 2018-05-30: qty 2

## 2018-05-30 MED ORDER — HYDROCHLOROTHIAZIDE 25 MG PO TABS: 25.0000 mg | ORAL_TABLET | Freq: Every day | ORAL | 0 refills | Status: DC

## 2018-05-30 MED ORDER — OXYCODONE-ACETAMINOPHEN 5-325 MG PO TABS
1.0000 | ORAL_TABLET | Freq: Once | ORAL | Status: AC
  Administered 2018-05-30: 1 via ORAL
  Filled 2018-05-30: qty 1

## 2018-05-30 MED ORDER — HYDROCHLOROTHIAZIDE 25 MG PO TABS
25.0000 mg | ORAL_TABLET | Freq: Once | ORAL | Status: AC
  Administered 2018-05-30: 25 mg via ORAL
  Filled 2018-05-30: qty 1

## 2018-05-30 MED ORDER — OXYCODONE-ACETAMINOPHEN 5-325 MG PO TABS: 1.0000 | ORAL_TABLET | Freq: Four times a day (QID) | ORAL | 0 refills | Status: DC | PRN

## 2018-05-30 MED ORDER — AMLODIPINE BESYLATE 10 MG PO TABS: 10.0000 mg | ORAL_TABLET | Freq: Every day | ORAL | 0 refills | Status: DC

## 2018-05-30 MED ORDER — FENTANYL CITRATE (PF) 100 MCG/2ML IJ SOLN
100.0000 ug | Freq: Once | INTRAMUSCULAR | Status: AC
  Administered 2018-05-30: 100 ug via INTRAVENOUS
  Filled 2018-05-30: qty 2

## 2018-05-30 NOTE — ED Notes (Signed)
MD with pt  

## 2018-05-30 NOTE — ED Provider Notes (Signed)
[place] [facility] Provider Note: Georgena Spurling, MD, FACEP  CSN: [id number] MRN: [id number] ARRIVAL: 05/30/18 at [place]  Leg Pain   HISTORY OF PRESENT ILLNESS  05/30/18 6:09 AM Roberta Young Roberta Young is a 47 y.o. female with a 3-week history of left leg pain.  The pain began in her left lateral buttock and is now radiating to her left thigh and lower leg.  She is unable to specify a dermatomal pattern; she states "it hurts all over and it moves around".  Pain is worse with movement of the left hip.  She variously describes the pain as being sharp or like a muscle spasm.  She states her pain was a 10 out of 10 on arrival but with improved after being given IV fentanyl.  She has some paresthesias of the left lower leg.  She is a patient of the [facility] but has not been in about a year.  She is currently in the process of making an appointment.  She has not been compliant with her antihypertensives.  Past Medical History:  Diagnosis Date  . Acute ear infection   . Anemia   . Depression   . Fibroids   . History of blood transfusion 2007   [facility]  . Hypertension   . SVD (spontaneous vaginal delivery)    x 2    Past Surgical History:  Procedure Laterality Date  . ABDOMINAL HYSTERECTOMY N/A 05/25/2013   Procedure: HYSTERECTOMY ABDOMINAL;  Surgeon: Woodroe Mode, MD;  Location: [facility];  Service: Gynecology;  Laterality: N/A;  . ABLATION    . BILATERAL SALPINGECTOMY Bilateral 05/25/2013   Procedure: BILATERAL SALPINGECTOMY;  Surgeon: Woodroe Mode, MD;  Location: [facility];  Service: Gynecology;  Laterality: Bilateral;  . blood transfusion  2007   ? 4 units transfused at [place]     x 1  . TUBAL LIGATION      Family History  Problem Relation Age of Onset  . Hypertension Mother   . Hypertension Father   . Diabetes Father   . Hypertension Sister   .  Kidney disease Maternal Aunt   . Heart disease Maternal Grandmother   . Heart disease Maternal Grandfather     Social History   Tobacco Use  . Smoking status: Current Every Day Smoker    Packs/day: 0.25    Years: 27.00    Pack years: 6.75    Types: Cigarettes  . Smokeless tobacco: Never Used  Substance Use Topics  . Alcohol use: Yes    Comment: socially  . Drug use: Yes    Frequency: 2.0 times per week    Types: Marijuana    Comment: weekends    Prior to Admission medications   Medication Sig Start Date End Date Taking? Authorizing Provider  amoxicillin (AMOXIL) 500 MG capsule Take 500 mg by mouth 3 (three) times daily.    [provider]  antipyrine-benzocaine Toniann Fail) otic solution Place 2-3 drops into both ears every 2 (two) hours as needed for ear pain.    [provider]  cephALEXin (KEFLEX) 500 MG capsule Take 1 capsule (500 mg total) by mouth 2 (two) times daily. 06/12/15   Gloriann Loan, PA-C  estradiol (ESTRACE) 1 MG tablet Take 1 tablet (1 mg total) by mouth daily. 05/27/13   Woodroe Mode, MD  ferrous sulfate 325 (65 FE) MG tablet Take 1 tablet (  325 mg total) by mouth daily with breakfast. 03/31/13   Woodroe Mode, MD  hydrochlorothiazide (HYDRODIURIL) 25 MG tablet Take 25 mg by mouth daily.    [provider]  lisinopril (PRINIVIL,ZESTRIL) 40 MG tablet Take 40 mg by mouth daily.    [provider]  neomycin-polymyxin-hydrocortisone (CORTISPORIN) otic solution Place 3-4 drops into both ears 4 (four) times daily.    [provider]  oxyCODONE-acetaminophen (PERCOCET/ROXICET) 5-325 MG per tablet Take 1-2 tablets by mouth every 4 (four) hours as needed for severe pain (moderate to severe pain (when tolerating fluids)). 05/27/13   Woodroe Mode, MD    Allergies Patient has no known allergies.   REVIEW OF SYSTEMS  Negative except as noted here or in the History of Present Illness.   PHYSICAL EXAMINATION  Initial  Vital Signs Blood pressure (!) 196/143, pulse 98, temperature 97.9 F (36.6 C), temperature source Oral, resp. rate 18, height 5' 6.5" (1.689 m), weight 59 kg, last menstrual period 03/16/2013, SpO2 97 %.  Examination General: Well-developed, well-nourished female in no acute distress; appearance consistent with age of record HENT: normocephalic; atraumatic Eyes: Normal appearance Neck: supple Heart: regular rate and rhythm Lungs: clear to auscultation bilaterally Abdomen: soft; nondistended; nontender; bowel sounds present Back: Tenderness of left sciatic notch; positive straight leg raise on the left at 60 degrees; negative straight leg raise on the right Extremities: No deformity; full range of motion; pulses normal Neurologic: Awake, alert and oriented; motor function intact in all extremities and symmetric; mildly decreased sensation of left lower leg; no facial droop Skin: Warm and dry Psychiatric: Flat affect   RESULTS  Summary of this visit's results, reviewed by myself:   EKG Interpretation  Date/Time:    Ventricular Rate:    PR Interval:    QRS Duration:   QT Interval:    QTC Calculation:   R Axis:     Text Interpretation:        Laboratory Studies: No results found for this or any previous visit (from the past 24 hour(s)). Imaging Studies: No results found.  ED COURSE and MDM  Nursing notes and initial vitals signs, including pulse oximetry, reviewed.  Vitals:   05/30/18 0023 05/30/18 0024 05/30/18 0346  BP: (!) 173/139  (!) 196/143  Pulse: 97  98  Resp: 18  18  Temp: 98.4 F (36.9 C)  97.9 F (36.6 C)  TempSrc: Oral  Oral  SpO2: 100%  97%  Weight:  59 kg   Height:  5' 6.5" (1.689 m)    As noted above the patient is planning to follow-up with the health and wellness clinic.  In the meantime we will refill her antihypertensives and treat her with a short course of analgesics for her back pain, likely sciatica.  Consultation with the [facility]  state controlled substances database reveals the patient has received no opioid prescriptions in the past 2 years.  PROCEDURES    ED DIAGNOSES     ICD-10-CM   1. Sciatica of left side M54.32   2. Hypertension not at goal [facility], Jenny Reichmann, MD 05/30/18 [phone]

## 2018-05-30 NOTE — ED Notes (Signed)
Pt has a ride home with familyl

## 2018-05-30 NOTE — ED Notes (Signed)
Assisted pt to car

## 2018-05-30 NOTE — ED Triage Notes (Signed)
Patient states that for the last month she has had pain to her left hip and down into her left leg. She reports that it feels like she is having muscle spasms to her left hip and it radiated with numbness to her left lower leg

## 2018-09-01 DIAGNOSIS — R109 Unspecified abdominal pain: Secondary | ICD-10-CM | POA: Insufficient documentation

## 2019-09-12 DIAGNOSIS — R011 Cardiac murmur, unspecified: Secondary | ICD-10-CM | POA: Insufficient documentation

## 2019-09-12 DIAGNOSIS — D171 Benign lipomatous neoplasm of skin and subcutaneous tissue of trunk: Secondary | ICD-10-CM | POA: Insufficient documentation

## 2019-09-12 DIAGNOSIS — F1220 Cannabis dependence, uncomplicated: Secondary | ICD-10-CM | POA: Insufficient documentation

## 2019-09-12 DIAGNOSIS — Z87891 Personal history of nicotine dependence: Secondary | ICD-10-CM | POA: Insufficient documentation

## 2019-09-12 DIAGNOSIS — I10 Essential (primary) hypertension: Secondary | ICD-10-CM | POA: Insufficient documentation

## 2020-07-25 LAB — HM MAMMOGRAPHY

## 2022-11-12 ENCOUNTER — Encounter (HOSPITAL_BASED_OUTPATIENT_CLINIC_OR_DEPARTMENT_OTHER): Payer: Self-pay

## 2022-11-12 ENCOUNTER — Inpatient Hospital Stay (HOSPITAL_BASED_OUTPATIENT_CLINIC_OR_DEPARTMENT_OTHER)
Admission: EM | Admit: 2022-11-12 | Discharge: 2022-11-15 | DRG: 305 | Disposition: A | Discharge status: Home / Self Care | Payer: Self-pay | Attending: Internal Medicine | Admitting: Internal Medicine

## 2022-11-12 ENCOUNTER — Other Ambulatory Visit: Payer: Self-pay

## 2022-11-12 ENCOUNTER — Emergency Department (HOSPITAL_BASED_OUTPATIENT_CLINIC_OR_DEPARTMENT_OTHER): Payer: Self-pay

## 2022-11-12 DIAGNOSIS — Z90710 Acquired absence of both cervix and uterus: Secondary | ICD-10-CM

## 2022-11-12 DIAGNOSIS — I5032 Chronic diastolic (congestive) heart failure: Secondary | ICD-10-CM | POA: Diagnosis present

## 2022-11-12 DIAGNOSIS — Z87891 Personal history of nicotine dependence: Secondary | ICD-10-CM

## 2022-11-12 DIAGNOSIS — Z79899 Other long term (current) drug therapy: Secondary | ICD-10-CM

## 2022-11-12 DIAGNOSIS — Z91148 Patient's other noncompliance with medication regimen for other reason: Secondary | ICD-10-CM

## 2022-11-12 DIAGNOSIS — Z8249 Family history of ischemic heart disease and other diseases of the circulatory system: Secondary | ICD-10-CM

## 2022-11-12 DIAGNOSIS — I160 Hypertensive urgency: Secondary | ICD-10-CM | POA: Diagnosis present

## 2022-11-12 DIAGNOSIS — I110 Hypertensive heart disease with heart failure: Secondary | ICD-10-CM | POA: Diagnosis present

## 2022-11-12 DIAGNOSIS — R0602 Shortness of breath: Secondary | ICD-10-CM

## 2022-11-12 DIAGNOSIS — I161 Hypertensive emergency: Principal | ICD-10-CM | POA: Diagnosis present

## 2022-11-12 DIAGNOSIS — E876 Hypokalemia: Secondary | ICD-10-CM | POA: Diagnosis present

## 2022-11-12 DIAGNOSIS — I700 Atherosclerosis of aorta: Secondary | ICD-10-CM | POA: Diagnosis present

## 2022-11-12 DIAGNOSIS — Z833 Family history of diabetes mellitus: Secondary | ICD-10-CM

## 2022-11-12 LAB — CBC WITH DIFFERENTIAL/PLATELET
Abs Immature Granulocytes: 0.02 10*3/uL (ref 0.00–0.07)
Basophils Absolute: 0.1 10*3/uL (ref 0.0–0.1)
Basophils Relative: 1 %
Eosinophils Absolute: 0.2 10*3/uL (ref 0.0–0.5)
Eosinophils Relative: 2 %
HCT: 40.8 % (ref 36.0–46.0)
Hemoglobin: 13.4 g/dL (ref 12.0–15.0)
Immature Granulocytes: 0 %
Lymphocytes Relative: 25 %
Lymphs Abs: 2.4 10*3/uL (ref 0.7–4.0)
MCH: 28.3 pg (ref 26.0–34.0)
MCHC: 32.8 g/dL (ref 30.0–36.0)
MCV: 86.1 fL (ref 80.0–100.0)
Monocytes Absolute: 0.7 10*3/uL (ref 0.1–1.0)
Monocytes Relative: 8 %
Neutro Abs: 6 10*3/uL (ref 1.7–7.7)
Neutrophils Relative %: 64 %
Platelets: 267 10*3/uL (ref 150–400)
RBC: 4.74 MIL/uL (ref 3.87–5.11)
RDW: 12.2 % (ref 11.5–15.5)
WBC: 9.4 10*3/uL (ref 4.0–10.5)
nRBC: 0 % (ref 0.0–0.2)

## 2022-11-12 LAB — BASIC METABOLIC PANEL
Anion gap: 10 (ref 5–15)
BUN: 13 mg/dL (ref 6–20)
CO2: 25 mmol/L (ref 22–32)
Calcium: 8.9 mg/dL (ref 8.9–10.3)
Chloride: 103 mmol/L (ref 98–111)
Creatinine, Ser: 0.78 mg/dL (ref 0.44–1.00)
GFR, Estimated: >60 mL/min (ref >60)
Glucose, Bld: 121 mg/dL — ABNORMAL HIGH (ref 70–99)
Potassium: 3.5 mmol/L (ref 3.5–5.1)
Sodium: 138 mmol/L (ref 135–145)

## 2022-11-12 LAB — BRAIN NATRIURETIC PEPTIDE: B Natriuretic Peptide: 167.5 pg/mL — ABNORMAL HIGH (ref 0.0–100.0)

## 2022-11-12 LAB — TROPONIN I (HIGH SENSITIVITY): Troponin I (High Sensitivity): 17 ng/L (ref <18)

## 2022-11-12 MED ORDER — ALBUTEROL SULFATE HFA 108 (90 BASE) MCG/ACT IN AERS: 2.0000 | INHALATION_SPRAY | RESPIRATORY_TRACT | Status: DC | PRN

## 2022-11-12 MED ORDER — IOHEXOL 350 MG/ML SOLN
75.0000 mL | Freq: Once | INTRAVENOUS | Status: AC | PRN
  Administered 2022-11-12: 75 mL via INTRAVENOUS

## 2022-11-12 NOTE — ED Provider Notes (Signed)
[facility] Provider Note   CSN: [id number] Arrival date & time: 11/12/22  1942     History  Chief Complaint  Patient presents with   Shortness of Breath    Roberta Young is a 52 y.o. female.  Patient reports 3 days of shortness of breath/inability to get a breath that originally started at night and woke her up from sleep. She said she would wake up gasping for air and it would last for an hour. She would need to place multiple pillows to prop herself up to nearly vertical. An episode then happened today at work. She denies headaches, light headedness or dizziness,vision changes, CP, pleuritic CP,palpitations, weight gain, LE edema. She has had some intermittent cough with sputum production long standing. Shortness of breath has some diffuse chest tightness associated with it and does not worsen with activity. She smoked starting at age 67 until 5 years ago, 2 packs per day. Endorses snoring, denies unrestful sleep or morning headaches. Endorse being admitted in 2021 for fluid around her heart, this feels similar. Says she does not take BP meds but has a history of HTN. Only occasionally drinks alcohol, occasionally uses marijuana but no cocaine or other drug use.    Shortness of Breath      Home Medications Prior to Admission medications   Medication Sig Start Date End Date Taking? Authorizing Provider  amLODipine (NORVASC) 10 MG tablet Take 1 tablet (10 mg total) by mouth daily. 05/30/18   Molpus, John, MD  hydrochlorothiazide (HYDRODIURIL) 25 MG tablet Take 1 tablet (25 mg total) by mouth daily. 05/30/18   Molpus, John, MD  oxyCODONE-acetaminophen (PERCOCET/ROXICET) 5-325 MG tablet Take 1 tablet by mouth every 6 (six) hours as needed for severe pain. 05/30/18   Molpus, Jonny Ruiz, MD      Allergies    Patient has no known allergies.    Review of Systems   Review of Systems  Respiratory:  Positive for shortness of breath.   All  other systems reviewed and are negative.   Physical Exam Updated Vital Signs BP (!) 170/129 (BP Location: Right Arm)   Pulse 89   Temp 98 F (36.7 C) (Oral)   Resp (!) 26   Ht 5' 6.5" (1.689 m)   Wt 72.6 kg   LMP 03/16/2013   SpO2 95%   BMI 25.44 kg/m  Physical Exam Constitutional:      General: She is not in acute distress.    Appearance: She is well-developed. She is not ill-appearing or diaphoretic.  Cardiovascular:     Rate and Rhythm: Normal rate and regular rhythm.     Pulses: Normal pulses.     Heart sounds: Normal heart sounds. No murmur heard.    No gallop.  Pulmonary:     Effort: Pulmonary effort is normal. No tachypnea or respiratory distress.     Breath sounds: Decreased breath sounds present. No wheezing, rhonchi or rales.  Musculoskeletal:     Right lower leg: No tenderness. No edema.  Skin:    General: Skin is warm and dry.     Capillary Refill: Capillary refill takes less than 2 seconds.  Neurological:     Mental Status: She is alert.     ED Results / Procedures / Treatments   Labs (all labs ordered are listed, but only abnormal results are displayed) Labs Reviewed  CBC WITH DIFFERENTIAL/PLATELET  BASIC METABOLIC PANEL  BRAIN NATRIURETIC PEPTIDE  TROPONIN I (HIGH SENSITIVITY)  EKG EKG Interpretation  Date/Time:  Wednesday Nov 12 2022 19:54:27 EDT Ventricular Rate:  83 PR Interval:  174 QRS Duration: 98 QT Interval:  428 QTC Calculation: 503 R Axis:   10 Text Interpretation: Sinus rhythm LAE, consider biatrial enlargement Left ventricular hypertrophy ST elev, probable normal early repol pattern Borderline prolonged QT interval when compared to prior, slightly longer QTC and some wandering baseline. No STEMI Confirmed by Theda Belfast ([id number]) on 11/12/2022 9:17:55 PM  Radiology DG Chest 2 View  Result Date: 11/12/2022 CLINICAL DATA:  Shortness of breath x3 days. EXAM: CHEST - 2 VIEW COMPARISON:  September 12, 2019 FINDINGS: The heart size  and mediastinal contours are within normal limits. The lungs are hyperinflated. Mild, diffuse, chronic appearing increased interstitial lung markings are seen. There is no evidence of focal consolidation, pleural effusion or pneumothorax. The visualized skeletal structures are unremarkable. IMPRESSION: Chronic appearing increased interstitial lung markings without evidence of acute or active cardiopulmonary disease. Electronically Signed   By: Aram Candela M.D.   On: 11/12/2022 20:28    Procedures Procedures    Medications Ordered in ED Medications  albuterol (VENTOLIN HFA) 108 (90 Base) MCG/ACT inhaler 2 puff (has no administration in time range)    ED Course/ Medical Decision Making/ A&P                             Medical Decision Making Patient presents with 3 days of orthopnea and PND that she says feels similar to when she was admitted in 2021 for severe symptomatic HTN causing pulmonary edema. She presents afebrile, pulse wnl, HTN to 174/133, normal respiratory rate satting 97% on RA. Lung exam clear to auscultation. CXR with no acute cardiopulmonary process but chronic increased interstitial markings.EKG with NSR, poor R wave progression, minimal ST elevation in V1 otherwise no acute ST/T wave or ischemic changes, LAE and LVH. Given LVH and new orthopnea heart failure is possible. On exam no JVD or LE edema but could just be left sided heart failure, will get a BNP. Of note last echo performed in 2021 when she had flash pulmonary edema and there wa sno wall motion abnormalities and normal EF.Will also get troponins to evaluate for any cardiac ischemia. Will get CBC to evaluate for anemia, low suspicion for infection given no evidence on cxr and afebrile. Not tachycardic, not hypoxic, no LE swelling/warmth/ erythema and no pleuritic chest pain so low suspicion for PE but PERC > 1 and patient has had some unilateral swelling and throbbing reported in prior days so will get CT PE study. No  wheezing on exam to suggest asthma or COPD process.Also getting ambulatory oxygen sats to ensure no desaturation with ambulation. Signed patient out to ED provider taking over care.  Amount and/or Complexity of Data Reviewed Labs: ordered. Radiology: ordered.  Risk Prescription drug management.   Final Clinical Impression(s) / ED Diagnoses Final diagnoses:  SOB (shortness of breath)    Rx / DC Orders ED Discharge Orders     None         Willette Cluster, MD 11/12/22 2322    Tegeler, Canary Brim, MD 11/14/22 [phone]

## 2022-11-12 NOTE — ED Provider Notes (Addendum)
  Provider Note MRN:  [id number]  Arrival date & time: 11/13/22    ED Course and Medical Decision Making  Assumed care from Dr. Julieanne Manson at shift change.  Shortness of breath with laying flat, awaiting labs, CTA chest.  3 AM update: Patient with continued shortness of breath with any attempted laying flat.  Persistently hypertensive.  Not really responding to hydralazine.  Had some unexplained nausea, looks generally unwell.  Became concerned for possible press given the persistent hypertension.  CTA is reassuring.  With the continued hypertension and no great reason for the shortness of breath, will admit to medicine for further management.  On my last evaluation patient says she is feeling better, no longer nauseated, has a normal neurologic exam and so doubt significant CNS process at this time.  .Critical Care  Performed by: Sabas Sous, MD Authorized by: Sabas Sous, MD   Critical care provider statement:    Critical care time (minutes):  35   Critical care was necessary to treat or prevent imminent or life-threatening deterioration of the following conditions: hypertensive urgency.   Critical care was time spent personally by me on the following activities:  Development of treatment plan with patient or surrogate, discussions with consultants, evaluation of patient's response to treatment, examination of patient, ordering and review of laboratory studies, ordering and review of radiographic studies, ordering and performing treatments and interventions, pulse oximetry, re-evaluation of patient's condition and review of old charts   Final Clinical Impressions(s) / ED Diagnoses     ICD-10-CM   1. SOB (shortness of breath)  R06.02     2. Hypertensive urgency  I16.0       ED Discharge Orders     None       Discharge Instructions   None     Elmer Sow. Pilar Plate, MD [facility] [email or web address]    Sabas Sous,  MD 11/13/22 [id number]    Sabas Sous, MD 11/13/22 [phone]

## 2022-11-12 NOTE — ED Triage Notes (Signed)
SHOB x3 days. Pt reports worse at night.

## 2022-11-12 NOTE — ED Notes (Signed)
Patient walked to the bathroom and when she came back she was very SOB. Patient had an increased work of breathing and could only take a few steps at a time and then had to stop. Patient sat back down in bed and pulse ox applied. Patient's nails prevent an accurate pleth wave.

## 2022-11-13 ENCOUNTER — Other Ambulatory Visit: Payer: Self-pay

## 2022-11-13 ENCOUNTER — Emergency Department (HOSPITAL_BASED_OUTPATIENT_CLINIC_OR_DEPARTMENT_OTHER): Payer: Self-pay

## 2022-11-13 ENCOUNTER — Encounter (HOSPITAL_COMMUNITY): Payer: Self-pay | Admitting: Internal Medicine

## 2022-11-13 DIAGNOSIS — I160 Hypertensive urgency: Secondary | ICD-10-CM | POA: Diagnosis present

## 2022-11-13 DIAGNOSIS — E876 Hypokalemia: Secondary | ICD-10-CM | POA: Diagnosis present

## 2022-11-13 DIAGNOSIS — I700 Atherosclerosis of aorta: Secondary | ICD-10-CM | POA: Diagnosis present

## 2022-11-13 LAB — TROPONIN I (HIGH SENSITIVITY): Troponin I (High Sensitivity): 13 ng/L (ref <18)

## 2022-11-13 LAB — BASIC METABOLIC PANEL
Anion gap: 9 (ref 5–15)
BUN: 14 mg/dL (ref 6–20)
CO2: 26 mmol/L (ref 22–32)
Calcium: 9.2 mg/dL (ref 8.9–10.3)
Chloride: 104 mmol/L (ref 98–111)
Creatinine, Ser: 0.88 mg/dL (ref 0.44–1.00)
GFR, Estimated: >60 mL/min (ref >60)
Glucose, Bld: 152 mg/dL — ABNORMAL HIGH (ref 70–99)
Potassium: 3.1 mmol/L — ABNORMAL LOW (ref 3.5–5.1)
Sodium: 139 mmol/L (ref 135–145)

## 2022-11-13 LAB — GLUCOSE, CAPILLARY: Glucose-Capillary: 136 mg/dL — ABNORMAL HIGH (ref 70–99)

## 2022-11-13 LAB — HEPATIC FUNCTION PANEL
ALT: 34 U/L (ref 0–44)
AST: 30 U/L (ref 15–41)
Albumin: 4.4 g/dL (ref 3.5–5.0)
Alkaline Phosphatase: 90 U/L (ref 38–126)
Bilirubin, Direct: 0.1 mg/dL (ref 0.0–0.2)
Indirect Bilirubin: 1 mg/dL — ABNORMAL HIGH (ref 0.3–0.9)
Total Bilirubin: 1.1 mg/dL (ref 0.3–1.2)
Total Protein: 8.2 g/dL — ABNORMAL HIGH (ref 6.5–8.1)

## 2022-11-13 LAB — MRSA NEXT GEN BY PCR, NASAL: MRSA by PCR Next Gen: NOT DETECTED

## 2022-11-13 LAB — PHOSPHORUS: Phosphorus: 3.6 mg/dL (ref 2.5–4.6)

## 2022-11-13 LAB — MAGNESIUM: Magnesium: 2.1 mg/dL (ref 1.7–2.4)

## 2022-11-13 MED ORDER — FUROSEMIDE 10 MG/ML IJ SOLN
20.0000 mg | Freq: Once | INTRAMUSCULAR | Status: AC
  Administered 2022-11-13: 20 mg via INTRAVENOUS
  Filled 2022-11-13: qty 2

## 2022-11-13 MED ORDER — METOPROLOL TARTRATE 25 MG PO TABS
25.0000 mg | ORAL_TABLET | Freq: Two times a day (BID) | ORAL | Status: DC
  Administered 2022-11-13 – 2022-11-14 (×3): 25 mg via ORAL
  Filled 2022-11-13 (×4): qty 1

## 2022-11-13 MED ORDER — AMLODIPINE BESYLATE 5 MG PO TABS: 10.0000 mg | ORAL_TABLET | Freq: Every day | ORAL | Status: DC

## 2022-11-13 MED ORDER — POTASSIUM CHLORIDE CRYS ER 20 MEQ PO TBCR
40.0000 meq | EXTENDED_RELEASE_TABLET | Freq: Every day | ORAL | Status: AC
  Administered 2022-11-13: 40 meq via ORAL
  Filled 2022-11-13: qty 2

## 2022-11-13 MED ORDER — ONDANSETRON HCL 4 MG/2ML IJ SOLN
4.0000 mg | Freq: Once | INTRAMUSCULAR | Status: AC
  Administered 2022-11-13: 4 mg via INTRAVENOUS
  Filled 2022-11-13: qty 2

## 2022-11-13 MED ORDER — ENOXAPARIN SODIUM 40 MG/0.4ML IJ SOSY
40.0000 mg | PREFILLED_SYRINGE | INTRAMUSCULAR | Status: DC
  Administered 2022-11-13 – 2022-11-14 (×2): 40 mg via SUBCUTANEOUS
  Filled 2022-11-13 (×2): qty 0.4

## 2022-11-13 MED ORDER — ACETAMINOPHEN 650 MG RE SUPP: 650.0000 mg | Freq: Four times a day (QID) | RECTAL | Status: DC | PRN

## 2022-11-13 MED ORDER — CHLORHEXIDINE GLUCONATE CLOTH 2 % EX PADS
6.0000 | MEDICATED_PAD | Freq: Every day | CUTANEOUS | Status: DC
  Administered 2022-11-13 – 2022-11-14 (×2): 6 via TOPICAL

## 2022-11-13 MED ORDER — PROCHLORPERAZINE EDISYLATE 10 MG/2ML IJ SOLN
5.0000 mg | Freq: Four times a day (QID) | INTRAMUSCULAR | Status: DC | PRN
  Administered 2022-11-13: 5 mg via INTRAVENOUS
  Filled 2022-11-13: qty 2

## 2022-11-13 MED ORDER — LABETALOL HCL 5 MG/ML IV SOLN
20.0000 mg | Freq: Once | INTRAVENOUS | Status: AC
  Administered 2022-11-13: 20 mg via INTRAVENOUS
  Filled 2022-11-13: qty 4

## 2022-11-13 MED ORDER — HYDRALAZINE HCL 20 MG/ML IJ SOLN
20.0000 mg | Freq: Once | INTRAMUSCULAR | Status: AC
  Administered 2022-11-13: 20 mg via INTRAVENOUS
  Filled 2022-11-13: qty 1

## 2022-11-13 MED ORDER — ACETAMINOPHEN 325 MG PO TABS
650.0000 mg | ORAL_TABLET | Freq: Four times a day (QID) | ORAL | Status: DC | PRN
  Administered 2022-11-13 – 2022-11-14 (×3): 650 mg via ORAL
  Filled 2022-11-13 (×3): qty 2

## 2022-11-13 MED ORDER — HYDROCHLOROTHIAZIDE 25 MG PO TABS: 25.0000 mg | ORAL_TABLET | Freq: Every day | ORAL | Status: DC

## 2022-11-13 MED ORDER — HYDRALAZINE HCL 20 MG/ML IJ SOLN
10.0000 mg | INTRAMUSCULAR | Status: AC
  Administered 2022-11-13: 10 mg via INTRAVENOUS
  Filled 2022-11-13: qty 1

## 2022-11-13 MED ORDER — METOPROLOL TARTRATE 5 MG/5ML IV SOLN
5.0000 mg | Freq: Once | INTRAVENOUS | Status: AC
  Administered 2022-11-13: 5 mg via INTRAVENOUS
  Filled 2022-11-13: qty 5

## 2022-11-13 MED ORDER — METOPROLOL TARTRATE 5 MG/5ML IV SOLN: 10.0000 mg | Freq: Once | INTRAVENOUS | Status: DC

## 2022-11-13 MED ORDER — NICARDIPINE HCL IN NACL 20-0.86 MG/200ML-% IV SOLN
3.0000 mg/h | INTRAVENOUS | Status: DC
  Administered 2022-11-13: 10 mg/h via INTRAVENOUS
  Administered 2022-11-13 (×3): 5 mg/h via INTRAVENOUS
  Administered 2022-11-13: 3 mg/h via INTRAVENOUS
  Filled 2022-11-13 (×7): qty 200

## 2022-11-13 MED ORDER — IOHEXOL 350 MG/ML SOLN
75.0000 mL | Freq: Once | INTRAVENOUS | Status: AC | PRN
  Administered 2022-11-13: 75 mL via INTRAVENOUS

## 2022-11-13 MED ORDER — POTASSIUM CHLORIDE CRYS ER 20 MEQ PO TBCR
40.0000 meq | EXTENDED_RELEASE_TABLET | Freq: Once | ORAL | Status: AC
  Administered 2022-11-13: 40 meq via ORAL
  Filled 2022-11-13: qty 2

## 2022-11-13 MED ORDER — HYDROCHLOROTHIAZIDE 25 MG PO TABS
25.0000 mg | ORAL_TABLET | Freq: Every day | ORAL | Status: DC
  Administered 2022-11-13 – 2022-11-15 (×3): 25 mg via ORAL
  Filled 2022-11-13 (×4): qty 1

## 2022-11-13 MED ORDER — HYDRALAZINE HCL 50 MG PO TABS
50.0000 mg | ORAL_TABLET | Freq: Four times a day (QID) | ORAL | Status: DC | PRN
  Administered 2022-11-14: 50 mg via ORAL
  Filled 2022-11-13 (×2): qty 1

## 2022-11-13 MED ORDER — AMLODIPINE BESYLATE 10 MG PO TABS
10.0000 mg | ORAL_TABLET | Freq: Every day | ORAL | Status: DC
  Administered 2022-11-13: 10 mg via ORAL
  Filled 2022-11-13: qty 1
  Filled 2022-11-13: qty 2

## 2022-11-13 NOTE — ED Notes (Signed)
Pt called out for emesis EDP notified and antiemetic meds ordered and given.

## 2022-11-13 NOTE — ED Notes (Signed)
Provider aware of pt BP prior to leaving. Pt is not symptomatic at this time. Pt not vomiting, pt not nauseous. Pt just received oral BP meds and IV BP meds.

## 2022-11-13 NOTE — ED Notes (Signed)
[facility] called for transport. 

## 2022-11-13 NOTE — ED Notes (Signed)
Pt is actively vomiting again. Awaiting orders at this time.

## 2022-11-13 NOTE — ED Notes (Signed)
[facility] called again to schedule transport

## 2022-11-13 NOTE — ED Notes (Signed)
Pt given saltines and apple juice to take with her BP meds. Pt states she gets sick when taking medications on empty stomach.

## 2022-11-13 NOTE — H&P (Signed)
History and Physical    Patient: Roberta Young ZOX:[id number] DOB: 12/11/1970 DOA: 11/12/2022 DOS: the patient was seen and examined on 11/13/2022 PCP: Pcp, No  Patient coming from: Home  Chief Complaint:  Chief Complaint  Patient presents with   Shortness of Breath   HPI: Roberta Young is a 52 y.o. female with medical history significant of ear infection, anemia, depression, fibroids, symptomatic anemia, history of blood transfusion, hypertension not on antihypertensives for about a year who presented to the emergency department complaints of dyspnea for the past 3 days that is worse at night when she is lying down.  She denied fever, chills, rhinorrhea, sore throat, wheezing or hemoptysis.  No chest pain, palpitations, diaphoresis, PND, orthopnea or pitting edema of the lower extremities.  No abdominal pain, nausea, emesis, diarrhea, constipation, melena or hematochezia.  No flank pain, dysuria, frequency or hematuria.  No polyuria, polydipsia, polyphagia or blurred vision.   ED course: Initial vital signs were temperature 98 F, pulse 90 mg fraction 20, BP 174/133 mmHg O2 sat 97% on room air.  The patient received hydralazine 20 mg IVP x 1, hydrate 10 mg IVP x 1, Lopressor 5 mg IVP x 1, labetalol 20 mg IVP x 1, ondansetron 4 mg IVP x 2, furosemide 20 mg IVP and was started on a nicardipine infusion.  Lab work: CBC was normal.  Troponin x 2 negative.  BNP 167.5 pg/mL.  BMP showed a glucose of 121 mg/dL but was otherwise normal.  Normal phosphorus and magnesium.  LFTs showed a total protein 8.2 g/dL and slightly increasing direct bilirubin of 1.0 mg/dL.  Imaging: 2 view chest radiograph with chronic appearing increased interstitial lung markings without evidence of acute or active cardiopulmonary disease.  Heart size and mediastinal contours were within normal limits.  Hyperinflated lungs.  CTA chest with no evidence of pulmonary embolism.  Stable likely benign bilateral upper lobe  noncalcified lung nodules.  Very mild calcification of the aortic arch.  Aortic atherosclerosis.  CTA head and neck with no acute intracranial process.  No intracranial large vessel occlusion or significant stenosis.   Review of Systems: As mentioned in the history of present illness. All other systems reviewed and are negative. Past Medical History:  Diagnosis Date   Acute ear infection    Anemia    Depression    Fibroids    History of blood transfusion 2007   [facility]   Hypertension    SVD (spontaneous vaginal delivery)    x 2   Past Surgical History:  Procedure Laterality Date   ABDOMINAL HYSTERECTOMY N/A 05/25/2013   Procedure: HYSTERECTOMY ABDOMINAL;  Surgeon: Adam Phenix, MD;  Location: [facility];  Service: Gynecology;  Laterality: N/A;   ABLATION     BILATERAL SALPINGECTOMY Bilateral 05/25/2013   Procedure: BILATERAL SALPINGECTOMY;  Surgeon: Adam Phenix, MD;  Location: [facility];  Service: Gynecology;  Laterality: Bilateral;   blood transfusion  2007   ? 4 units [facility] at [facility]   CESAREAN SECTION     x 1   TUBAL LIGATION     Social History:  reports that she quit smoking about 5 years ago. Her smoking use included cigarettes. She has a 6.75 pack-year smoking history. She has never used smokeless tobacco. She reports current alcohol use. She reports current drug use. Frequency: 2.00 times per week. Drug: Marijuana.  No Known Allergies  Family History  Problem Relation Age of Onset   Hypertension Mother  Hypertension Father    Diabetes Father    Hypertension Sister    Kidney disease Maternal Aunt    Heart disease Maternal Grandmother    Heart disease Maternal Grandfather     Prior to Admission medications   Medication Sig Start Date End Date Taking? Authorizing Provider  amLODipine (NORVASC) 10 MG tablet Take 1 tablet (10 mg total) by mouth daily. 05/30/18   Molpus, John, MD  hydrochlorothiazide (HYDRODIURIL) 25 MG tablet Take 1  tablet (25 mg total) by mouth daily. 05/30/18   Molpus, John, MD  oxyCODONE-acetaminophen (PERCOCET/ROXICET) 5-325 MG tablet Take 1 tablet by mouth every 6 (six) hours as needed for severe pain. 05/30/18   Molpus, Jonny Ruiz, MD    Physical Exam: Vitals:   11/13/22 0509 11/13/22 0619 11/13/22 0700 11/13/22 0723  BP: (!) 175/130 (!) 207/158 (!) 203/139   Pulse:  73 79   Resp:  14 13   Temp:  (!) 97.5 F (36.4 C)  98.2 F (36.8 C)  TempSrc:  Oral  Oral  SpO2:  100% 100%   Weight:      Height:       Physical Exam Vitals and nursing note reviewed.  Constitutional:      General: She is awake. She is not in acute distress.    Appearance: She is well-developed.  HENT:     Head: Normocephalic.     Nose: No rhinorrhea.     Mouth/Throat:     Mouth: Mucous membranes are moist.  Eyes:     General: No scleral icterus.    Pupils: Pupils are equal, round, and reactive to light.  Neck:     Vascular: No JVD.  Cardiovascular:     Rate and Rhythm: Normal rate and regular rhythm.  Pulmonary:     Effort: Pulmonary effort is normal.     Breath sounds: Normal breath sounds. No wheezing, rhonchi or rales.  Abdominal:     General: Bowel sounds are normal. There is no distension.     Palpations: Abdomen is soft.     Tenderness: There is no abdominal tenderness. There is no guarding.  Musculoskeletal:     Cervical back: Neck supple.     Right lower leg: No edema.     Left lower leg: No edema.  Skin:    General: Skin is warm and dry.  Neurological:     General: No focal deficit present.     Mental Status: She is alert and oriented to person, place, and time.  Psychiatric:        Behavior: Behavior is cooperative.    Data Reviewed:  Results are pending, will review when available.  Assessment and Plan: Principal Problem:   Hypertensive urgency Inpatient/stepdown. Continue nicardipine infusion for now. Transitioning to amlodipine 10 mg p.o. daily. Continue hydrochlorothiazide 25 mg p.o.  daily. Continue metoprolol 25 mg p.o. twice daily. Begin hydralazine 50 mg p.o. every 6 hours as needed. Consider adding an ACE inhibitor if needed.  Active Problems:   Aortic atherosclerosis (HCC) Not sure if she has hyperlipidemia. Would benefit from statin therapy. Needs to establish with a PCP to follow this closely.    Hypokalemia Replacement ordered. Follow-up potassium level.     Advance Care Planning:   Code Status: Full Code   Consults:   Family Communication:   Severity of Illness: The appropriate patient status for this patient is INPATIENT. Inpatient status is judged to be reasonable and necessary in order to provide the required intensity of service  to ensure the patient's safety. The patient's presenting symptoms, physical exam findings, and initial radiographic and laboratory data in the context of their chronic comorbidities is felt to place them at high risk for further clinical deterioration. Furthermore, it is not anticipated that the patient will be medically stable for discharge from the hospital within 2 midnights of admission.   * I certify that at the point of admission it is my clinical judgment that the patient will require inpatient hospital care spanning beyond 2 midnights from the point of admission due to high intensity of service, high risk for further deterioration and high frequency of surveillance required.*  Author: Bobette Mo, MD 11/13/2022 7:56 AM  For on call review [email or web address].   This document was prepared using Dragon voice recognition software and may contain some unintended transcription errors.

## 2022-11-13 NOTE — Progress Notes (Addendum)
This is a 52 year old female with past medical history of HTN, depression, anemia, fibroids.  She presented to [facility] with hypertensive emergency.  Patient complained of nausea and orthopnea.  Patient became very short of breath each time she attempted to lay flat.  Her presenting blood pressure was 174/133. She was given 20 mg IV labetalol, 20 mg IV hydralazine and 20 mg IV Lasix.  Her blood pressures remained in the 170s -180s /130s.  CT head and neck and CTA chest were both unrevealing. Overnight observation requested for continued blood pressure control   Home meds resumed Norvasc 10 mg p.o. daily, HCTZ 25 mg p.o. daily.  First dose now.  Metoprolol 25 mg p.o. twice daily added

## 2022-11-14 ENCOUNTER — Inpatient Hospital Stay (HOSPITAL_COMMUNITY): Payer: Self-pay

## 2022-11-14 DIAGNOSIS — R9431 Abnormal electrocardiogram [ECG] [EKG]: Secondary | ICD-10-CM

## 2022-11-14 DIAGNOSIS — R0609 Other forms of dyspnea: Secondary | ICD-10-CM

## 2022-11-14 DIAGNOSIS — E876 Hypokalemia: Secondary | ICD-10-CM

## 2022-11-14 LAB — ECHOCARDIOGRAM COMPLETE
AR max vel: 2.32 cm2
AV Area VTI: 2.22 cm2
AV Area mean vel: 2.13 cm2
AV Mean grad: 5 mmHg
AV Peak grad: 10.2 mmHg
Ao pk vel: 1.6 m/s
Area-P 1/2: 3.34 cm2
Calc EF: 52.2 %
Height: 66.5 in
MV M vel: 5.64 m/s
MV Peak grad: 127.2 mmHg
Radius: 0.7 cm
S' Lateral: 4.5 cm
Single Plane A2C EF: 44.3 %
Single Plane A4C EF: 60.1 %
Weight: 2560 oz

## 2022-11-14 LAB — HIV ANTIBODY (ROUTINE TESTING W REFLEX): HIV Screen 4th Generation wRfx: NONREACTIVE

## 2022-11-14 LAB — GLUCOSE, CAPILLARY: Glucose-Capillary: 118 mg/dL — ABNORMAL HIGH (ref 70–99)

## 2022-11-14 MED ORDER — POTASSIUM CHLORIDE CRYS ER 20 MEQ PO TBCR
40.0000 meq | EXTENDED_RELEASE_TABLET | Freq: Two times a day (BID) | ORAL | Status: AC
  Administered 2022-11-14 (×2): 40 meq via ORAL
  Filled 2022-11-14 (×2): qty 2

## 2022-11-14 MED ORDER — LISINOPRIL 5 MG PO TABS
5.0000 mg | ORAL_TABLET | Freq: Every day | ORAL | Status: DC
  Administered 2022-11-14: 5 mg via ORAL
  Filled 2022-11-14: qty 2

## 2022-11-14 MED ORDER — CARVEDILOL 12.5 MG PO TABS: 12.5000 mg | ORAL_TABLET | Freq: Two times a day (BID) | ORAL | Status: DC

## 2022-11-14 MED ORDER — ROSUVASTATIN CALCIUM 10 MG PO TABS
10.0000 mg | ORAL_TABLET | Freq: Every day | ORAL | Status: DC
  Administered 2022-11-14 – 2022-11-15 (×2): 10 mg via ORAL
  Filled 2022-11-14 (×2): qty 1

## 2022-11-14 MED ORDER — CARVEDILOL 6.25 MG PO TABS
6.2500 mg | ORAL_TABLET | Freq: Two times a day (BID) | ORAL | Status: DC
  Administered 2022-11-14: 6.25 mg via ORAL
  Filled 2022-11-14: qty 1

## 2022-11-14 NOTE — Progress Notes (Signed)
[facility] PROGRESS NOTE    Progress Note  Roberta Young  GNF:[id number] DOB: 1971/03/23 DOA: 11/12/2022 PCP: Pcp, No     Brief Narrative:   Roberta Young is an 52 y.o. female past medical history significant of anemia depression symptomatic anemia history of hypertension noncompliant with antihypertensive medication for about a year presents to the ED with dyspnea that started 3 days prior to admission, orthopnea Blood pressure in the ED was 174/133 started on nicardipine drip   Assessment/Plan:   Hypertensive emergency Started on nicardipine drip, and oral amlodipine. Also started on hydrochlorothiazide and metoprolol we were able to wean her down to nicardipine. Her blood pressure this morning is 153/93. Will probably add an ACE inhibitor. 2D echo is pending.  Hypokalemia: Replete orally recheck in the morning.  Aortic atherosclerosis (HCC) Started on statin therapy.  DVT prophylaxis: lovenox Family Communication:none Status is: Inpatient Remains inpatient appropriate because: Hypertensive emergency    Code Status:     Code Status Orders  (From admission, onward)           Start     Ordered   11/13/22 0809  Full code  Continuous       Question:  By:  Answer:  Consent: discussion documented in EHR   11/13/22 0810           Code Status History     Date Active Date Inactive Code Status Order ID Comments User Context   05/25/2013 1355 05/27/2013 1412 Full Code [id number]  Adam Phenix, MD Inpatient         IV Access:   Peripheral IV   Procedures and diagnostic studies:   CT ANGIO HEAD NECK W WO CM  Result Date: 11/13/2022 CLINICAL DATA:  Shortness of breath, concern for hemorrhagic stroke EXAM: CT ANGIOGRAPHY HEAD AND NECK WITH AND WITHOUT CONTRAST TECHNIQUE: Multidetector CT imaging of the head and neck was performed using the standard protocol during bolus administration of intravenous contrast. Multiplanar CT image  reconstructions and MIPs were obtained to evaluate the vascular anatomy. Carotid stenosis measurements (when applicable) are obtained utilizing NASCET criteria, using the distal internal carotid diameter as the denominator. RADIATION DOSE REDUCTION: This exam was performed according to the departmental dose-optimization program which includes automated exposure control, adjustment of the mA and/or kV according to patient size and/or use of iterative reconstruction technique. CONTRAST:  75mL OMNIPAQUE IOHEXOL 350 MG/ML SOLN COMPARISON:  No prior CT available, correlation is made with CT head 11/27/2011 FINDINGS: CT HEAD FINDINGS Brain: No evidence of acute infarct, hemorrhage, mass, mass effect, or midline shift. No hydrocephalus or extra-axial fluid collection. Vascular: Contrast is noted in the vasculature from a prior CTA chest. Skull: Negative for fracture or focal lesion. Sinuses/Orbits: No acute finding. Other: The mastoid air cells are well aerated. CTA NECK FINDINGS Aortic arch: Standard branching. Imaged portion shows no evidence of aneurysm or dissection. No significant stenosis of the major arch vessel origins. Right carotid system: No evidence of dissection, occlusion, or hemodynamically significant stenosis (greater than 50%). Left carotid system: No evidence of dissection, occlusion, or hemodynamically significant stenosis (greater than 50%). Vertebral arteries: No evidence of dissection, occlusion, or hemodynamically significant stenosis (greater than 50%). Skeleton: No acute osseous abnormality. Degenerative changes in the cervical spine. Other neck: Multiple hypodense lesions in the thyroid, the largest of which measures up to 8 mm, for which no follow-up is indicated. (Reference: J Am Coll Radiol. 2015 Feb;12(2): 143-50) Upper chest: No focal pulmonary opacity or pleural  effusion. Review of the MIP images confirms the above findings CTA HEAD FINDINGS Anterior circulation: Both internal carotid  arteries are patent to the termini, without significant stenosis. A1 segments patent, hypoplastic on the left. Normal anterior communicating artery. Anterior cerebral arteries are patent to their distal aspects without significant stenosis. No M1 stenosis or occlusion. MCA branches perfused to their distal aspects without significant stenosis. Posterior circulation: Vertebral arteries patent to the vertebrobasilar junction without significant stenosis. Posterior inferior cerebellar artery patent on the left. Common origin of the right AICA and PICA. Basilar patent to its distal aspect without significant stenosis. Superior cerebellar arteries patent proximally. Patent P1 segments. PCAs perfused to their distal aspects without significant stenosis. The right posterior communicating artery is patent. Venous sinuses: Well opacified, patent. Anatomic variants: None significant. Review of the MIP images confirms the above findings IMPRESSION: 1. No acute intracranial process. 2. No intracranial large vessel occlusion or significant stenosis. 3. No hemodynamically significant stenosis in the neck. Electronically Signed   By: Wiliam Ke M.D.   On: 11/13/2022 02:42   CT Angio Chest PE W and/or Wo Contrast  Result Date: 11/12/2022 CLINICAL DATA:  Shortness of breath and leg pain. EXAM: CT ANGIOGRAPHY CHEST WITH CONTRAST TECHNIQUE: Multidetector CT imaging of the chest was performed using the standard protocol during bolus administration of intravenous contrast. Multiplanar CT image reconstructions and MIPs were obtained to evaluate the vascular anatomy. RADIATION DOSE REDUCTION: This exam was performed according to the departmental dose-optimization program which includes automated exposure control, adjustment of the mA and/or kV according to patient size and/or use of iterative reconstruction technique. CONTRAST:  75mL OMNIPAQUE IOHEXOL 350 MG/ML SOLN COMPARISON:  September 12, 2019 FINDINGS: Cardiovascular: There is  very mild calcification of the aortic arch, without evidence of aortic aneurysm. Satisfactory opacification of the pulmonary arteries to the segmental level. No evidence of pulmonary embolism. Normal heart size. No pericardial effusion. Mediastinum/Nodes: No enlarged mediastinal, hilar, or axillary lymph nodes. Thyroid gland, trachea, and esophagus demonstrate no significant findings. Lungs/Pleura: A stable, likely benign 4 mm noncalcified lung nodule is seen within the anterolateral aspect of the left upper lobe (axial CT image 20, CT series 5). A stable, likely benign 5 mm noncalcified lung nodule is seen within the lateral aspect of the right upper lobe (axial CT image 41, CT series 5). A 5 mm calcified lung nodule is seen within the lateral aspect of the right lower lobe. There is no evidence of an acute infiltrate, pleural effusion or pneumothorax. Upper Abdomen: No acute abnormality. Musculoskeletal: No chest wall abnormality. No acute or significant osseous findings. Review of the MIP images confirms the above findings. IMPRESSION: 1. No evidence of pulmonary embolism or other acute intrathoracic process. 2. Stable, likely benign bilateral upper lobe noncalcified lung nodules. 3. Very mild calcification of the aortic arch, without evidence of aortic aneurysm. 4. Aortic atherosclerosis. Aortic Atherosclerosis (ICD10-I70.0). Electronically Signed   By: Aram Candela M.D.   On: 11/12/2022 23:57   DG Chest 2 View  Result Date: 11/12/2022 CLINICAL DATA:  Shortness of breath x3 days. EXAM: CHEST - 2 VIEW COMPARISON:  September 12, 2019 FINDINGS: The heart size and mediastinal contours are within normal limits. The lungs are hyperinflated. Mild, diffuse, chronic appearing increased interstitial lung markings are seen. There is no evidence of focal consolidation, pleural effusion or pneumothorax. The visualized skeletal structures are unremarkable. IMPRESSION: Chronic appearing increased interstitial lung  markings without evidence of acute or active cardiopulmonary disease. Electronically Signed  By: Aram Candela M.D.   On: 11/12/2022 20:28     Medical Consultants:   None.   Subjective:    Roberta Young relates her breathing is significantly better.  Objective:    Vitals:   11/14/22 0600 11/14/22 0615 11/14/22 0630 11/14/22 0645  BP: (!) 146/102 (!) 142/96 133/88 (!) 153/93  Pulse: 78 77  76  Resp: 16 15 16 16   Temp:      TempSrc:      SpO2: 100% 100%  100%  Weight:      Height:       SpO2: 100 %   Intake/Output Summary (Last 24 hours) at 11/14/2022 0654 Last data filed at 11/14/2022 0300 Gross per 24 hour  Intake 996.95 ml  Output 1300 ml  Net -303.05 ml   Filed Weights   11/12/22 1956  Weight: 72.6 kg    Exam: General exam: In no acute distress. Respiratory system: Good air movement and clear to auscultation. Cardiovascular system: S1 & S2 heard, RRR. No JVD. Gastrointestinal system: Abdomen is nondistended, soft and nontender.  Extremities: No pedal edema. Skin: No rashes, lesions or ulcers Psychiatry: Judgement and insight appear normal. Mood & affect appropriate.    Data Reviewed:    Labs: Basic Metabolic Panel: Recent Labs  Lab 11/12/22 2301 11/13/22 0925  NA 138 139  K 3.5 3.1*  CL 103 104  CO2 25 26  GLUCOSE 121* 152*  BUN 13 14  CREATININE 0.78 0.88  CALCIUM 8.9 9.2  MG  --  2.1  PHOS  --  3.6   GFR Estimated Creatinine Clearance: 77.1 mL/min (by C-G formula based on SCr of 0.88 mg/dL). Liver Function Tests: Recent Labs  Lab 11/13/22 0925  AST 30  ALT 34  ALKPHOS 90  BILITOT 1.1  PROT 8.2*  ALBUMIN 4.4   No results for input(s): "LIPASE", "AMYLASE" in the last 168 hours. No results for input(s): "AMMONIA" in the last 168 hours. Coagulation profile No results for input(s): "INR", "PROTIME" in the last 168 hours. COVID-19 Labs  No results for input(s): "DDIMER", "FERRITIN", "LDH", "CRP" in the last 72 hours.  No  results found for: "SARSCOV2NAA"  CBC: Recent Labs  Lab 11/12/22 2301  WBC 9.4  NEUTROABS 6.0  HGB 13.4  HCT 40.8  MCV 86.1  PLT 267   Cardiac Enzymes: No results for input(s): "CKTOTAL", "CKMB", "CKMBINDEX", "TROPONINI" in the last 168 hours. BNP (last 3 results) No results for input(s): "PROBNP" in the last 8760 hours. CBG: Recent Labs  Lab 11/13/22 0633  GLUCAP 136*   D-Dimer: No results for input(s): "DDIMER" in the last 72 hours. Hgb A1c: No results for input(s): "HGBA1C" in the last 72 hours. Lipid Profile: No results for input(s): "CHOL", "HDL", "LDLCALC", "TRIG", "CHOLHDL", "LDLDIRECT" in the last 72 hours. Thyroid function studies: No results for input(s): "TSH", "T4TOTAL", "T3FREE", "THYROIDAB" in the last 72 hours.  Invalid input(s): "FREET3" Anemia work up: No results for input(s): "VITAMINB12", "FOLATE", "FERRITIN", "TIBC", "IRON", "RETICCTPCT" in the last 72 hours. Sepsis Labs: Recent Labs  Lab 11/12/22 2301  WBC 9.4   Microbiology Recent Results (from the past 240 hour(s))  MRSA Next Gen by PCR, Nasal     Status: None   Collection Time: 11/13/22  3:12 PM   Specimen: Nasal Mucosa; Nasal Swab  Result Value Ref Range Status   MRSA by PCR Next Gen NOT DETECTED NOT DETECTED Final    Comment: (NOTE) The GeneXpert MRSA Assay (FDA approved for  NASAL specimens only), is one component of a comprehensive MRSA colonization surveillance program. It is not intended to diagnose MRSA infection nor to guide or monitor treatment for MRSA infections. Test performance is not FDA approved in patients less than 67 years old. Performed at [facility], [address] [place], [place] [address]      Medications:    Chlorhexidine Gluconate Cloth  6 each Topical Daily   enoxaparin (LOVENOX) injection  40 mg Subcutaneous Q24H   hydrochlorothiazide  25 mg Oral Daily   metoprolol tartrate  25 mg Oral BID   Continuous Infusions:  niCARDipine  Stopped (11/14/22 0247)      LOS: 1 day   Roberta Young  [facility]  11/14/2022, 6:54 AM

## 2022-11-14 NOTE — Plan of Care (Signed)

## 2022-11-14 NOTE — TOC Initial Note (Signed)
Transition of Care [facility]) - Initial/Assessment Note    Patient Details  Name: Roberta Young MRN: [id number] Date of Birth: 01-26-1971  [facility] CM/SW Contact:    Otelia Santee, LCSW Phone Number: 11/14/2022, 3:21 PM  Clinical Narrative:                 Met with pt who shares she lives in a house by herself. She has a cane at home she uses when her sciatica flares. She works every day during the week and states she works long hours. Pt currently does not have insurance and shares she needs a new PCP. Pt prefers [facility] (medical) [facility] at [facility]. Appointment has been scheduled for 12/03/22 at 2pm.  TOC will continue to follow for further needs.    Expected Discharge Plan: Home/Self Care Barriers to Discharge: Continued Medical Work up   Patient Goals and CMS Choice Patient states their goals for this hospitalization and ongoing recovery are:: To go home CMS Medicare.gov Compare Post Acute Care list provided to:: Patient Choice offered to / list presented to : Patient [place] ownership interest in [facility].provided to:: Patient    Expected Discharge Plan and Services In-house Referral: NA Discharge Planning Services: [facility] Post Acute Care Choice: NA Living arrangements for the past 2 months: Single Family Home                 DME Arranged: N/A DME Agency: NA                  Prior Living Arrangements/Services Living arrangements for the past 2 months: Single Family Home Lives with:: Self Patient language and need for interpreter reviewed:: Yes Do you feel safe going back to the place where you live?: Yes      Need for Family Participation in Patient Care: No (Comment) Care giver support system in place?: No (comment) Current home services: DME [facility]) Criminal Activity/Legal Involvement Pertinent to Current Situation/Hospitalization: No - Comment as needed  Activities of Daily Living Home Assistive  Devices/Equipment: None ADL Screening (condition at time of admission) Patient's cognitive ability adequate to safely complete daily activities?: Yes Is the patient deaf or have difficulty hearing?: No Does the patient have difficulty seeing, even when wearing glasses/contacts?: No Does the patient have difficulty concentrating, remembering, or making decisions?: No Patient able to express need for assistance with ADLs?: Yes Does the patient have difficulty dressing or bathing?: No Independently performs ADLs?: Yes (appropriate for developmental age) Does the patient have difficulty walking or climbing stairs?: No Weakness of Legs: None Weakness of Arms/Hands: None  Permission Sought/Granted Permission sought to share information with : PCP Permission granted to share information with : Yes, Verbal Permission Granted              Emotional Assessment Appearance:: Appears stated age Attitude/Demeanor/Rapport: Engaged Affect (typically observed): Accepting, Pleasant Orientation: : Oriented to Self, Oriented to Place, Oriented to  Time, Oriented to Situation Alcohol / Substance Use: Not Applicable Psych Involvement: No (comment)  Admission diagnosis:  SOB (shortness of breath) [R06.02] Hypertensive urgency [I16.0] Patient Active Problem List   Diagnosis Date Noted   Hypertensive urgency 11/13/2022   Aortic atherosclerosis (HCC) 11/13/2022   Hypokalemia 11/13/2022   Cannabis use disorder, severe, dependence (HCC) 09/12/2019   Essential hypertension 09/12/2019   Lipoma of back 09/12/2019   Systolic murmur 09/12/2019   Former smoker 09/12/2019   Abdominal spasms 09/01/2018   Fibroid uterus 06/29/2013  Anemia 11/05/2012   PCP:  Pcp, No Pharmacy:   [id number] - [place], [place] - [address] [place] [address] Phone: [phone] Fax: [phone]     Social Determinants of Health (SDOH) Social History: SDOH Screenings    Food Insecurity: No Food Insecurity (11/13/2022)  Housing: Low Risk  (11/13/2022)  Transportation Needs: No Transportation Needs (11/13/2022)  Utilities: Not At Risk (11/13/2022)  Tobacco Use: Medium Risk (11/13/2022)   SDOH Interventions:     Readmission Risk Interventions    11/14/2022    3:05 PM  Readmission Risk Prevention Plan  Transportation Screening Complete

## 2022-11-15 LAB — BASIC METABOLIC PANEL
Anion gap: 10 (ref 5–15)
BUN: 18 mg/dL (ref 6–20)
CO2: 26 mmol/L (ref 22–32)
Calcium: 9.2 mg/dL (ref 8.9–10.3)
Chloride: 104 mmol/L (ref 98–111)
Creatinine, Ser: 0.94 mg/dL (ref 0.44–1.00)
GFR, Estimated: >60 mL/min (ref >60)
Glucose, Bld: 89 mg/dL (ref 70–99)
Potassium: 4.1 mmol/L (ref 3.5–5.1)
Sodium: 140 mmol/L (ref 135–145)

## 2022-11-15 MED ORDER — HYDROCHLOROTHIAZIDE 25 MG PO TABS: 25.0000 mg | ORAL_TABLET | Freq: Every day | ORAL | 0 refills | Status: DC

## 2022-11-15 MED ORDER — LISINOPRIL 10 MG PO TABS
10.0000 mg | ORAL_TABLET | Freq: Every day | ORAL | Status: DC
  Filled 2022-11-15: qty 1

## 2022-11-15 MED ORDER — HYDRALAZINE HCL 50 MG PO TABS: 50.0000 mg | ORAL_TABLET | Freq: Three times a day (TID) | ORAL | Status: DC

## 2022-11-15 MED ORDER — CARVEDILOL 12.5 MG PO TABS: 12.5000 mg | ORAL_TABLET | Freq: Two times a day (BID) | ORAL | 3 refills | Status: DC

## 2022-11-15 MED ORDER — LISINOPRIL 20 MG PO TABS: 20.0000 mg | ORAL_TABLET | Freq: Every day | ORAL | Status: DC

## 2022-11-15 MED ORDER — AMLODIPINE BESYLATE 10 MG PO TABS: 10.0000 mg | ORAL_TABLET | Freq: Every day | ORAL | 0 refills | Status: DC

## 2022-11-15 MED ORDER — LISINOPRIL 20 MG PO TABS: 20.0000 mg | ORAL_TABLET | Freq: Every day | ORAL | 3 refills | Status: DC

## 2022-11-15 MED ORDER — ROSUVASTATIN CALCIUM 10 MG PO TABS: 10.0000 mg | ORAL_TABLET | Freq: Every day | ORAL | 0 refills | Status: DC

## 2022-11-15 MED ORDER — CARVEDILOL 12.5 MG PO TABS
12.5000 mg | ORAL_TABLET | Freq: Two times a day (BID) | ORAL | Status: DC
  Administered 2022-11-15: 12.5 mg via ORAL
  Filled 2022-11-15: qty 1

## 2022-11-15 NOTE — Progress Notes (Signed)
Discharge instructions reviewed with pt.  Questions answered to her satisfaction.  IV removed and pt awaiting ride home.

## 2022-11-15 NOTE — Discharge Summary (Signed)
Physician Discharge Summary  [name]:[id number] DOB: 09-Mar-1971 DOA: 11/12/2022  PCP: Oneita Hurt, No  Admit date: 11/12/2022 Discharge date: 11/15/2022  Admitted From: Home Disposition:  home  Recommendations for Outpatient Follow-up:  Follow up with PCP in 1-2 weeks Please obtain BMP/CBC in one week   Home Health:No Equipment/Devices:none  Discharge Condition:Stable CODE STATUS:Full Diet recommendation: Heart Healthy   Brief/Interim Summary: 52 y.o. female past medical history significant of anemia depression symptomatic anemia history of hypertension noncompliant with antihypertensive medication for about a year presents to the ED with dyspnea that started 3 days prior to admission, orthopnea Blood pressure in the ED was 174/133 started on nicardipine drip  Discharge Diagnoses:  Principal Problem:   Hypertensive urgency Active Problems:   Aortic atherosclerosis (HCC)   Hypokalemia  Hypertensive emergency: Started on nicardipine drip, she had been noncompliant with his medication. She was started back on amlodipine hydrochlorothiazide and Coreg. Her blood pressure improved she was weaned off nicardipine. ACE inhibitor was added. Her blood pressure has been ranging 150/100. 2D echo was done that showed chronic diastolic heart failure. She will follow-up with PCP as an outpatient and titrate antihypertensive medication as an outpatient.  Chronic diastolic heart failure: Currently on Coreg, hydrochlorothiazide and lisinopril. Follow-up with PCP as an send outpatient titrate antihypertensive medications and tolerated.  Hypokalemia: Repeat orally now resolved.  Aortic atherosclerosis: Continue statins.  Discharge Instructions  Discharge Instructions     Diet - low sodium heart healthy   Complete by: As directed    Increase activity slowly   Complete by: As directed       Allergies as of 11/15/2022   No Known Allergies      Medication List     STOP  taking these medications    multivitamin tablet   oxyCODONE-acetaminophen 5-325 MG tablet Commonly known as: PERCOCET/ROXICET   TYLENOL 500 MG tablet Generic drug: acetaminophen       TAKE these medications    amLODipine 10 MG tablet Commonly known as: NORVASC Take 1 tablet (10 mg total) by mouth daily.   carvedilol 12.5 MG tablet Commonly known as: COREG Take 1 tablet (12.5 mg total) by mouth 2 (two) times daily with a meal.   hydrochlorothiazide 25 MG tablet Commonly known as: HYDRODIURIL Take 1 tablet (25 mg total) by mouth daily.   lisinopril 20 MG tablet Commonly known as: ZESTRIL Take 1 tablet (20 mg total) by mouth daily. Start taking on: November 16, 2022   rosuvastatin 10 MG tablet Commonly known as: CRESTOR Take 1 tablet (10 mg total) by mouth daily. Start taking on: November 16, 2022        Follow-up Information     [facility]. Go on 12/03/2022.   Specialty: Behavioral Health Why: You have an appointment scheduled on Wednesday, 12/03/22 at 2:00pm to establish primary care services. Please arrive 15 minutes early to complete new patient paperwork. Please bring ID and insurance if available to this appointment. If need to cancel appointment please call at least 24 hours in advance. Contact information: [address] [place] [address] [phone]               No Known Allergies  Consultations: None   Procedures/Studies: ECHOCARDIOGRAM COMPLETE  Result Date: 11/14/2022    ECHOCARDIOGRAM REPORT   Patient Name:   Roberta Young Date of Exam: 11/14/2022 Medical Rec #:  [id number]  Height:       66.5 in Accession #:    [id number]       Weight:       160.0 lb Date of Birth:  December 24, 1970         BSA:          1.829 m Patient Age:    51 years         BP:           153/93 mmHg Patient Gender: F                HR:           69 bpm. Exam Location:  Inpatient Procedure: 2D  Echo, Color Doppler, Cardiac Doppler and Strain Analysis Indications:    Abnormal ECG, Dyspnea  History:        Patient has no prior history of Echocardiogram examinations.                 Abnormal ECG, Signs/Symptoms:Dyspnea; Risk Factors:Hypertension.  Sonographer:    Milbert Coulter Referring Phys: [id number] DAVID MANUEL ORTIZ  Sonographer Comments: Global longitudinal strain was attempted. IMPRESSIONS  1. Left ventricular ejection fraction, by estimation, is 50 to 55%. The left ventricle has low normal function. The left ventricle has no regional wall motion abnormalities. There is moderate concentric left ventricular hypertrophy. Left ventricular diastolic parameters are consistent with Grade I diastolic dysfunction (impaired relaxation). The average left ventricular global longitudinal strain is -12.2 %. The global longitudinal strain is abnormal.  2. Right ventricular systolic function is normal. The right ventricular size is normal. There is normal pulmonary artery systolic pressure.  3. Left atrial size was severely dilated.  4. The mitral valve is normal in structure. Moderate mitral valve regurgitation. No evidence of mitral stenosis.  5. The aortic valve is tricuspid. Aortic valve regurgitation is not visualized. No aortic stenosis is present.  6. The inferior vena cava is normal in size with greater than 50% respiratory variability, suggesting right atrial pressure of 3 mmHg. FINDINGS  Left Ventricle: Left ventricular ejection fraction, by estimation, is 50 to 55%. The left ventricle has low normal function. The left ventricle has no regional wall motion abnormalities. The average left ventricular global longitudinal strain is -12.2 %. The global longitudinal strain is abnormal. The left ventricular internal cavity size was normal in size. There is moderate concentric left ventricular hypertrophy. Left ventricular diastolic parameters are consistent with Grade I diastolic dysfunction (impaired relaxation).  Right Ventricle: The right ventricular size is normal. No increase in right ventricular wall thickness. Right ventricular systolic function is normal. There is normal pulmonary artery systolic pressure. The tricuspid regurgitant velocity is 2.40 m/s, and  with an assumed right atrial pressure of 3 mmHg, the estimated right ventricular systolic pressure is 26.0 mmHg. Left Atrium: Left atrial size was severely dilated. Right Atrium: Right atrial size was normal in size. Pericardium: There is no evidence of pericardial effusion. Mitral Valve: The mitral valve is normal in structure. Moderate mitral valve regurgitation. No evidence of mitral valve stenosis. Tricuspid Valve: The tricuspid valve is normal in structure. Tricuspid valve regurgitation is trivial. No evidence of tricuspid stenosis. Aortic Valve: The aortic valve is tricuspid. Aortic valve regurgitation is not visualized. No aortic stenosis is present. Aortic valve mean gradient measures 5.0 mmHg. Aortic valve peak gradient measures 10.2 mmHg. Aortic valve area, by VTI measures 2.22  cm. Pulmonic Valve: The pulmonic valve was normal in structure. Pulmonic valve regurgitation is not visualized. No  evidence of pulmonic stenosis. Aorta: The aortic root is normal in size and structure. Venous: The inferior vena cava is normal in size with greater than 50% respiratory variability, suggesting right atrial pressure of 3 mmHg. IAS/Shunts: No atrial level shunt detected by color flow Doppler.  LEFT VENTRICLE PLAX 2D LVIDd:         5.40 cm     Diastology LVIDs:         4.50 cm     LV e' medial:    5.35 cm/s LV PW:         1.38 cm     LV E/e' medial:  13.9 LV IVS:        1.56 cm     LV e' lateral:   12.10 cm/s LVOT diam:     2.10 cm     LV E/e' lateral: 6.1 LV SV:         57 LV SV Index:   31          2D Longitudinal Strain LVOT Area:     3.46 cm    2D Strain GLS (A2C):   -12.4 %                            2D Strain GLS (A3C):   -10.8 %                            2D  Strain GLS (A4C):   -13.4 % LV Volumes (MOD)           2D Strain GLS Avg:     -12.2 % LV vol d, MOD A2C: 75.4 ml LV vol d, MOD A4C: 99.1 ml LV vol s, MOD A2C: 42.0 ml LV vol s, MOD A4C: 39.5 ml LV SV MOD A2C:     33.4 ml LV SV MOD A4C:     99.1 ml LV SV MOD BP:      46.3 ml RIGHT VENTRICLE RV Basal diam:  2.70 cm RV Mid diam:    1.60 cm RV S prime:     14.90 cm/s TAPSE (M-mode): 2.6 cm LEFT ATRIUM              Index        RIGHT ATRIUM           Index LA diam:        4.10 cm  2.24 cm/m   RA Area:     15.20 cm LA Vol (A2C):   118.0 ml 64.51 ml/m  RA Volume:   37.50 ml  20.50 ml/m LA Vol (A4C):   91.7 ml  50.13 ml/m LA Biplane Vol: 106.0 ml 57.95 ml/m  AORTIC VALVE AV Area (Vmax):    2.32 cm AV Area (Vmean):   2.13 cm AV Area (VTI):     2.22 cm AV Vmax:           160.00 cm/s AV Vmean:          105.000 cm/s AV VTI:            0.259 m AV Peak Grad:      10.2 mmHg AV Mean Grad:      5.0 mmHg LVOT Vmax:         107.00 cm/s LVOT Vmean:        64.700 cm/s LVOT VTI:          0.166 m  LVOT/AV VTI ratio: 0.64  AORTA Ao Root diam: 3.50 cm Ao Asc diam:  3.60 cm MITRAL VALVE                  TRICUSPID VALVE MV Area (PHT): 3.34 cm       TR Peak grad:   23.0 mmHg MV Decel Time: 227 msec       TR Vmax:        240.00 cm/s MR Peak grad:    127.2 mmHg MR Mean grad:    83.0 mmHg    SHUNTS MR Vmax:         564.00 cm/s  Systemic VTI:  0.17 m MR Vmean:        431.0 cm/s   Systemic Diam: 2.10 cm MR PISA:         3.08 cm MR PISA Eff ROA: 17 mm MR PISA Radius:  0.70 cm MV E velocity: 74.10 cm/s MV A velocity: 92.10 cm/s MV E/A ratio:  0.80 Chilton Si MD Electronically signed by Chilton Si MD Signature Date/Time: 11/14/2022/12:19:49 PM    Final    CT ANGIO HEAD NECK W WO CM  Result Date: 11/13/2022 CLINICAL DATA:  Shortness of breath, concern for hemorrhagic stroke EXAM: CT ANGIOGRAPHY HEAD AND NECK WITH AND WITHOUT CONTRAST TECHNIQUE: Multidetector CT imaging of the head and neck was performed using the standard  protocol during bolus administration of intravenous contrast. Multiplanar CT image reconstructions and MIPs were obtained to evaluate the vascular anatomy. Carotid stenosis measurements (when applicable) are obtained utilizing NASCET criteria, using the distal internal carotid diameter as the denominator. RADIATION DOSE REDUCTION: This exam was performed according to the departmental dose-optimization program which includes automated exposure control, adjustment of the mA and/or kV according to patient size and/or use of iterative reconstruction technique. CONTRAST:  75mL OMNIPAQUE IOHEXOL 350 MG/ML SOLN COMPARISON:  No prior CT available, correlation is made with CT head 11/27/2011 FINDINGS: CT HEAD FINDINGS Brain: No evidence of acute infarct, hemorrhage, mass, mass effect, or midline shift. No hydrocephalus or extra-axial fluid collection. Vascular: Contrast is noted in the vasculature from a prior CTA chest. Skull: Negative for fracture or focal lesion. Sinuses/Orbits: No acute finding. Other: The mastoid air cells are well aerated. CTA NECK FINDINGS Aortic arch: Standard branching. Imaged portion shows no evidence of aneurysm or dissection. No significant stenosis of the major arch vessel origins. Right carotid system: No evidence of dissection, occlusion, or hemodynamically significant stenosis (greater than 50%). Left carotid system: No evidence of dissection, occlusion, or hemodynamically significant stenosis (greater than 50%). Vertebral arteries: No evidence of dissection, occlusion, or hemodynamically significant stenosis (greater than 50%). Skeleton: No acute osseous abnormality. Degenerative changes in the cervical spine. Other neck: Multiple hypodense lesions in the thyroid, the largest of which measures up to 8 mm, for which no follow-up is indicated. (Reference: J Am Coll Radiol. 2015 Feb;12(2): 143-50) Upper chest: No focal pulmonary opacity or pleural effusion. Review of the MIP images confirms the  above findings CTA HEAD FINDINGS Anterior circulation: Both internal carotid arteries are patent to the termini, without significant stenosis. A1 segments patent, hypoplastic on the left. Normal anterior communicating artery. Anterior cerebral arteries are patent to their distal aspects without significant stenosis. No M1 stenosis or occlusion. MCA branches perfused to their distal aspects without significant stenosis. Posterior circulation: Vertebral arteries patent to the vertebrobasilar junction without significant stenosis. Posterior inferior cerebellar artery patent on the left. Common origin of the right AICA and PICA. Basilar  patent to its distal aspect without significant stenosis. Superior cerebellar arteries patent proximally. Patent P1 segments. PCAs perfused to their distal aspects without significant stenosis. The right posterior communicating artery is patent. Venous sinuses: Well opacified, patent. Anatomic variants: None significant. Review of the MIP images confirms the above findings IMPRESSION: 1. No acute intracranial process. 2. No intracranial large vessel occlusion or significant stenosis. 3. No hemodynamically significant stenosis in the neck. Electronically Signed   By: Wiliam Ke M.D.   On: 11/13/2022 02:42   CT Angio Chest PE W and/or Wo Contrast  Result Date: 11/12/2022 CLINICAL DATA:  Shortness of breath and leg pain. EXAM: CT ANGIOGRAPHY CHEST WITH CONTRAST TECHNIQUE: Multidetector CT imaging of the chest was performed using the standard protocol during bolus administration of intravenous contrast. Multiplanar CT image reconstructions and MIPs were obtained to evaluate the vascular anatomy. RADIATION DOSE REDUCTION: This exam was performed according to the departmental dose-optimization program which includes automated exposure control, adjustment of the mA and/or kV according to patient size and/or use of iterative reconstruction technique. CONTRAST:  75mL OMNIPAQUE IOHEXOL 350  MG/ML SOLN COMPARISON:  September 12, 2019 FINDINGS: Cardiovascular: There is very mild calcification of the aortic arch, without evidence of aortic aneurysm. Satisfactory opacification of the pulmonary arteries to the segmental level. No evidence of pulmonary embolism. Normal heart size. No pericardial effusion. Mediastinum/Nodes: No enlarged mediastinal, hilar, or axillary lymph nodes. Thyroid gland, trachea, and esophagus demonstrate no significant findings. Lungs/Pleura: A stable, likely benign 4 mm noncalcified lung nodule is seen within the anterolateral aspect of the left upper lobe (axial CT image 20, CT series 5). A stable, likely benign 5 mm noncalcified lung nodule is seen within the lateral aspect of the right upper lobe (axial CT image 41, CT series 5). A 5 mm calcified lung nodule is seen within the lateral aspect of the right lower lobe. There is no evidence of an acute infiltrate, pleural effusion or pneumothorax. Upper Abdomen: No acute abnormality. Musculoskeletal: No chest wall abnormality. No acute or significant osseous findings. Review of the MIP images confirms the above findings. IMPRESSION: 1. No evidence of pulmonary embolism or other acute intrathoracic process. 2. Stable, likely benign bilateral upper lobe noncalcified lung nodules. 3. Very mild calcification of the aortic arch, without evidence of aortic aneurysm. 4. Aortic atherosclerosis. Aortic Atherosclerosis (ICD10-I70.0). Electronically Signed   By: Aram Candela M.D.   On: 11/12/2022 23:57   DG Chest 2 View  Result Date: 11/12/2022 CLINICAL DATA:  Shortness of breath x3 days. EXAM: CHEST - 2 VIEW COMPARISON:  September 12, 2019 FINDINGS: The heart size and mediastinal contours are within normal limits. The lungs are hyperinflated. Mild, diffuse, chronic appearing increased interstitial lung markings are seen. There is no evidence of focal consolidation, pleural effusion or pneumothorax. The visualized skeletal structures are  unremarkable. IMPRESSION: Chronic appearing increased interstitial lung markings without evidence of acute or active cardiopulmonary disease. Electronically Signed   By: Aram Candela M.D.   On: 11/12/2022 20:28   (Echo, Carotid, EGD, Colonoscopy, ERCP)    Subjective:   Discharge Exam: Vitals:   11/14/22 1910 11/15/22 0540  BP: (!) 143/88 (!) 158/112  Pulse: 82 70  Resp: 18 20  Temp: 99 F (37.2 C) 98.7 F (37.1 C)  SpO2: 100% 96%   Vitals:   11/14/22 1400 11/14/22 1437 11/14/22 1910 11/15/22 0540  BP:  (!) 134/105 (!) 143/88 (!) 158/112  Pulse:  76 82 70  Resp: 18 20 18  20  Temp:  98.3 F (36.8 C) 99 F (37.2 C) 98.7 F (37.1 C)  TempSrc:  Oral Oral   SpO2:  100% 100% 96%  Weight:      Height:        General: Pt is alert, awake, not in acute distress Cardiovascular: RRR, S1/S2 +, no rubs, no gallops Respiratory: CTA bilaterally, no wheezing, no rhonchi Abdominal: Soft, NT, ND, bowel sounds + Extremities: no edema, no cyanosis    The results of significant diagnostics from this hospitalization (including imaging, microbiology, ancillary and laboratory) are listed below for reference.     Microbiology: Recent Results (from the past 240 hour(s))  MRSA Next Gen by PCR, Nasal     Status: None   Collection Time: 11/13/22  3:12 PM   Specimen: Nasal Mucosa; Nasal Swab  Result Value Ref Range Status   MRSA by PCR Next Gen NOT DETECTED NOT DETECTED Final    Comment: (NOTE) The GeneXpert MRSA Assay (FDA approved for NASAL specimens only), is one component of a comprehensive MRSA colonization surveillance program. It is not intended to diagnose MRSA infection nor to guide or monitor treatment for MRSA infections. Test performance is not FDA approved in patients less than 72 years old. Performed at [facility], [address] [place], [place] [address]      Labs: BNP (last 3 results) Recent Labs    11/12/22 2301  BNP 167.5*   Basic  Metabolic Panel: Recent Labs  Lab 11/12/22 2301 11/13/22 0925  NA 138 139  K 3.5 3.1*  CL 103 104  CO2 25 26  GLUCOSE 121* 152*  BUN 13 14  CREATININE 0.78 0.88  CALCIUM 8.9 9.2  MG  --  2.1  PHOS  --  3.6   Liver Function Tests: Recent Labs  Lab 11/13/22 0925  AST 30  ALT 34  ALKPHOS 90  BILITOT 1.1  PROT 8.2*  ALBUMIN 4.4   No results for input(s): "LIPASE", "AMYLASE" in the last 168 hours. No results for input(s): "AMMONIA" in the last 168 hours. CBC: Recent Labs  Lab 11/12/22 2301  WBC 9.4  NEUTROABS 6.0  HGB 13.4  HCT 40.8  MCV 86.1  PLT 267   Cardiac Enzymes: No results for input(s): "CKTOTAL", "CKMB", "CKMBINDEX", "TROPONINI" in the last 168 hours. BNP: Invalid input(s): "POCBNP" CBG: Recent Labs  Lab 11/13/22 0633 11/14/22 2037  GLUCAP 136* 118*   D-Dimer No results for input(s): "DDIMER" in the last 72 hours. Hgb A1c No results for input(s): "HGBA1C" in the last 72 hours. Lipid Profile No results for input(s): "CHOL", "HDL", "LDLCALC", "TRIG", "CHOLHDL", "LDLDIRECT" in the last 72 hours. Thyroid function studies No results for input(s): "TSH", "T4TOTAL", "T3FREE", "THYROIDAB" in the last 72 hours.  Invalid input(s): "FREET3" Anemia work up No results for input(s): "VITAMINB12", "FOLATE", "FERRITIN", "TIBC", "IRON", "RETICCTPCT" in the last 72 hours. Urinalysis    Component Value Date/Time   COLORURINE YELLOW 06/12/2015 1415   APPEARANCEUR CLOUDY (A) 06/12/2015 1415   LABSPEC 1.023 06/12/2015 1415   PHURINE 7.5 06/12/2015 1415   GLUCOSEU NEGATIVE 06/12/2015 1415   HGBUR NEGATIVE 06/12/2015 1415   BILIRUBINUR NEGATIVE 06/12/2015 1415   KETONESUR NEGATIVE 06/12/2015 1415   PROTEINUR 30 (A) 06/12/2015 1415   NITRITE POSITIVE (A) 06/12/2015 1415   LEUKOCYTESUR MODERATE (A) 06/12/2015 1415   Sepsis Labs Recent Labs  Lab 11/12/22 2301  WBC 9.4   Microbiology Recent Results (from the past 240 hour(s))  MRSA Next Gen by PCR,  Nasal      Status: None   Collection Time: 11/13/22  3:12 PM   Specimen: Nasal Mucosa; Nasal Swab  Result Value Ref Range Status   MRSA by PCR Next Gen NOT DETECTED NOT DETECTED Final    Comment: (NOTE) The GeneXpert MRSA Assay (FDA approved for NASAL specimens only), is one component of a comprehensive MRSA colonization surveillance program. It is not intended to diagnose MRSA infection nor to guide or monitor treatment for MRSA infections. Test performance is not FDA approved in patients less than 3 years old. Performed at [facility], [address], [place], [place] [address]      SIGNED:   Marinda Elk, MD  [facility] 11/15/2022, 9:34 AM Pager   If 7PM-7AM, please contact night-coverage [email or web address] Password [email or web address]

## 2022-11-15 NOTE — TOC Transition Note (Signed)
Transition of Care [facility]) - CM/SW Discharge Note   Patient Details  Name: Roberta Young MRN: [id number] Date of Birth: September 16, 1970  [facility] CM/SW Contact:  Adrian Prows, RN Phone Number: 11/15/2022, 9:51 AM   Clinical Narrative:    D/C orders received; pt has PCP appt scheduled; no TOC needs.   Final next level of care: Home/Self Care Barriers to Discharge: No Barriers Identified   Patient Goals and CMS Choice CMS Medicare.gov Compare Post Acute Care list provided to:: Patient Choice offered to / list presented to : Patient  Discharge Placement                         Discharge Plan and Services Additional resources added to the After Visit Summary for   In-house Referral: NA Discharge Planning Services: [facility] Post Acute Care Choice: NA          DME Arranged: N/A DME Agency: NA                  Social Determinants of Health (SDOH) Interventions SDOH Screenings   Food Insecurity: No Food Insecurity (11/13/2022)  Housing: Low Risk  (11/13/2022)  Transportation Needs: No Transportation Needs (11/13/2022)  Utilities: Not At Risk (11/13/2022)  Tobacco Use: Medium Risk (11/13/2022)     Readmission Risk Interventions    11/14/2022    3:05 PM  Readmission Risk Prevention Plan  Transportation Screening Complete

## 2022-12-03 ENCOUNTER — Ambulatory Visit (INDEPENDENT_AMBULATORY_CARE_PROVIDER_SITE_OTHER): Payer: 59 | Admitting: Family Medicine

## 2022-12-03 ENCOUNTER — Encounter: Payer: Self-pay | Admitting: Family Medicine

## 2022-12-03 VITALS — BP 128/85 | HR 72 | Ht 66.5 in | Wt 155.0 lb

## 2022-12-03 DIAGNOSIS — I5032 Chronic diastolic (congestive) heart failure: Secondary | ICD-10-CM | POA: Diagnosis not present

## 2022-12-03 DIAGNOSIS — G4739 Other sleep apnea: Secondary | ICD-10-CM

## 2022-12-03 DIAGNOSIS — R5383 Other fatigue: Secondary | ICD-10-CM | POA: Diagnosis not present

## 2022-12-03 DIAGNOSIS — Z0000 Encounter for general adult medical examination without abnormal findings: Secondary | ICD-10-CM | POA: Diagnosis not present

## 2022-12-03 DIAGNOSIS — I700 Atherosclerosis of aorta: Secondary | ICD-10-CM | POA: Diagnosis not present

## 2022-12-03 DIAGNOSIS — D649 Anemia, unspecified: Secondary | ICD-10-CM | POA: Diagnosis not present

## 2022-12-03 DIAGNOSIS — E876 Hypokalemia: Secondary | ICD-10-CM

## 2022-12-03 DIAGNOSIS — I10 Essential (primary) hypertension: Secondary | ICD-10-CM

## 2022-12-03 DIAGNOSIS — D171 Benign lipomatous neoplasm of skin and subcutaneous tissue of trunk: Secondary | ICD-10-CM

## 2022-12-03 DIAGNOSIS — R0681 Apnea, not elsewhere classified: Secondary | ICD-10-CM | POA: Insufficient documentation

## 2022-12-03 MED ORDER — CARVEDILOL 12.5 MG PO TABS
12.5000 mg | ORAL_TABLET | Freq: Two times a day (BID) | ORAL | 3 refills | Status: AC
Start: 2022-12-03 — End: ?

## 2022-12-03 MED ORDER — ROSUVASTATIN CALCIUM 10 MG PO TABS
10.0000 mg | ORAL_TABLET | Freq: Every day | ORAL | 0 refills | Status: DC
Start: 2022-12-03 — End: 2023-01-22

## 2022-12-03 MED ORDER — HYDROCHLOROTHIAZIDE 25 MG PO TABS
25.0000 mg | ORAL_TABLET | Freq: Every day | ORAL | 0 refills | Status: DC
Start: 2022-12-03 — End: 2023-01-22

## 2022-12-03 MED ORDER — AMLODIPINE BESYLATE 10 MG PO TABS
10.0000 mg | ORAL_TABLET | Freq: Every day | ORAL | 0 refills | Status: DC
Start: 2022-12-03 — End: 2023-01-22

## 2022-12-03 NOTE — Progress Notes (Signed)
New Patient Office Visit  Subjective    Patient ID: Roberta Young, female    DOB: 1971/01/04  Age: 52 y.o. MRN: [id number]  CC:  Chief Complaint  Patient presents with   Establish Care    HPI Roberta Young presents to establish care   Discussed the use of AI scribe software for clinical note transcription with the patient, who gave verbal consent to proceed.  History of Present Illness   The patient, with a history of high blood pressure and recent hospitalization, presents after a period of non-compliance with medications. They report experiencing headaches, which they attribute to lisinopril, a medication they have been reluctant to restart due to past adverse reactions. They also report shortness of breath and were found to have fluid around the lungs during their recent hospitalization. The patient has been diagnosed with chronic diastolic heart failure.  The patient also reports symptoms suggestive of sleep apnea, including snoring and daytime tiredness. They mention that both their parents had sleep apnea and used CPAP machines.  Additionally, the patient has two lipomas, one on their left upper back and another near their spine. They express a desire to have these removed.  The patient also mentions a history of low iron levels and a previous blood transfusion. They report quitting smoking five years ago and occasional marijuana use. They deny alcohol use.           STOP-BANG for SLEEP APNEA Do you Snore loudly? Yes Do you often feel Tired during day? Yes Has anyone Observed you stop breathing? Yes History of high blood Pressure? Yes BMI >35? No Age >50? Yes Neck circumference >16 in? No Gender female? No 5-8 = high risk 3-4 = intermediate 0-2 = low risk         Outpatient Encounter Medications as of 12/03/2022  Medication Sig   [DISCONTINUED] amLODipine (NORVASC) 10 MG tablet Take 1 tablet (10 mg total) by mouth daily.   [DISCONTINUED] carvedilol  (COREG) 12.5 MG tablet Take 1 tablet (12.5 mg total) by mouth 2 (two) times daily with a meal.   [DISCONTINUED] hydrochlorothiazide (HYDRODIURIL) 25 MG tablet Take 1 tablet (25 mg total) by mouth daily.   [DISCONTINUED] rosuvastatin (CRESTOR) 10 MG tablet Take 1 tablet (10 mg total) by mouth daily.   amLODipine (NORVASC) 10 MG tablet Take 1 tablet (10 mg total) by mouth daily.   carvedilol (COREG) 12.5 MG tablet Take 1 tablet (12.5 mg total) by mouth 2 (two) times daily with a meal.   hydrochlorothiazide (HYDRODIURIL) 25 MG tablet Take 1 tablet (25 mg total) by mouth daily.   rosuvastatin (CRESTOR) 10 MG tablet Take 1 tablet (10 mg total) by mouth daily.   [DISCONTINUED] lisinopril (ZESTRIL) 20 MG tablet Take 1 tablet (20 mg total) by mouth daily.   No facility-administered encounter medications on file as of 12/03/2022.    Past Medical History:  Diagnosis Date   Acute ear infection    Anemia    Cannabis use disorder, severe, dependence (HCC) 09/12/2019   Depression    Fibroids    History of blood transfusion 06/16/2005   [facility]   Hypertension    Sciatic nerve pain    SVD (spontaneous vaginal delivery)    x 2    Past Surgical History:  Procedure Laterality Date   ABDOMINAL HYSTERECTOMY N/A 05/25/2013   Procedure: HYSTERECTOMY ABDOMINAL;  Surgeon: Adam Phenix, MD;  Location: [facility];  Service: Gynecology;  Laterality: N/A;   ABLATION  BILATERAL SALPINGECTOMY Bilateral 05/25/2013   Procedure: BILATERAL SALPINGECTOMY;  Surgeon: Adam Phenix, MD;  Location: [facility];  Service: Gynecology;  Laterality: Bilateral;   blood transfusion  2007   ? 4 units transfused at [facility]   CESAREAN SECTION     x 1   TUBAL LIGATION      Family History  Problem Relation Age of Onset   Hypertension Mother    Arthritis Mother    Hypertension Father    Diabetes Father    Hypertension Sister    Heart disease Maternal Grandmother    Heart disease Maternal  Grandfather    Kidney disease Maternal Aunt    Sickle cell trait Daughter     Social History   Socioeconomic History   Marital status: Single    Spouse name: Not on file   Number of children: Not on file   Years of education: Not on file   Highest education level: Not on file  Occupational History   Not on file  Tobacco Use   Smoking status: Former    Packs/day: 0.25    Years: 27.00    Additional pack years: 0.00    Total pack years: 6.75    Types: Cigarettes    Quit date: 10/11/2017    Years since quitting: 5.1   Smokeless tobacco: Never  Substance and Sexual Activity   Alcohol use: Yes    Comment: socially   Drug use: Yes    Frequency: 2.0 times per week    Types: Marijuana    Comment: weekends   Sexual activity: Not Currently    Partners: Male    Birth control/protection: Surgical  Other Topics Concern   Not on file  Social History Narrative   Not on file   Social Determinants of Health   Financial Resource Strain: Not on file  Food Insecurity: No Food Insecurity (11/13/2022)   Hunger Vital Sign    Worried About Running Out of Food in the Last Year: Never true    Ran Out of Food in the Last Year: Never true  Transportation Needs: No Transportation Needs (11/13/2022)   PRAPARE - Administrator, Civil Service (Medical): No    Lack of Transportation (Non-Medical): No  Physical Activity: Not on file  Stress: Not on file  Social Connections: Not on file  Intimate Partner Violence: Not At Risk (11/13/2022)   Humiliation, Afraid, Rape, and Kick questionnaire    Fear of Current or Ex-Partner: No    Emotionally Abused: No    Physically Abused: No    Sexually Abused: No    ROS All review of systems negative except what is listed in the HPI      Objective    BP 128/85   Pulse 72   Ht 5' 6.5" (1.689 m)   Wt 155 lb (70.3 kg)   LMP 03/16/2013   SpO2 99%   BMI 24.64 kg/m   Physical Exam Vitals reviewed.  Constitutional:      Appearance:  Normal appearance.  Cardiovascular:     Rate and Rhythm: Normal rate and regular rhythm.     Pulses: Normal pulses.     Heart sounds: Murmur heard.  Pulmonary:     Effort: Pulmonary effort is normal.     Breath sounds: Normal breath sounds.  Skin:    General: Skin is warm and dry.     Comments: Left upper back with ~7cm diameter lipoma, nontender; ~3cm cyst vs lipoma to midline  mid/upper back, nontender  Neurological:     Mental Status: She is alert and oriented to person, place, and time.  Psychiatric:        Mood and Affect: Mood normal.        Behavior: Behavior normal.        Thought Content: Thought content normal.        Judgment: Judgment normal.         Assessment & Plan:   Problem List Items Addressed This Visit     Anemia (Chronic)    Pt reports history. She is often fatigued. Labs today       Relevant Orders   CBC with Differential/Platelet   IBC + Ferritin   B12 and Folate Panel   Aortic atherosclerosis (HCC) (Chronic)    Continue Crestor, heart healthy diet, and regular exercise       Relevant Medications   amLODipine (NORVASC) 10 MG tablet   carvedilol (COREG) 12.5 MG tablet   hydrochlorothiazide (HYDRODIURIL) 25 MG tablet   rosuvastatin (CRESTOR) 10 MG tablet   Essential hypertension (Chronic)    Blood pressure is at goal for age and co-morbidities.   Recommendations: continue amlodipine 10 mg daily, coreg 12.5 mg BID, and HCTZ 25 mg daily; follow-up with cardiology  - BP goal <130/80 - monitor and log blood pressures at home - check around the same time each day in a relaxed setting - Limit salt to <2000 mg/day - Follow DASH eating plan (heart healthy diet) - limit alcohol to 2 standard drinks per day for men and 1 per day for women - avoid tobacco products - get at least 2 hours of regular aerobic exercise weekly Patient aware of signs/symptoms requiring further/urgent evaluation. Labs updated today.       Relevant Medications    amLODipine (NORVASC) 10 MG tablet   carvedilol (COREG) 12.5 MG tablet   hydrochlorothiazide (HYDRODIURIL) 25 MG tablet   rosuvastatin (CRESTOR) 10 MG tablet   Other Relevant Orders   CBC with Differential/Platelet   Comprehensive metabolic panel   Ambulatory referral to Cardiology   Lipid panel   Lipoma of back    Referral for surgical consult       Relevant Orders   Ambulatory referral to General Surgery   Chronic diastolic heart failure (HCC) (Chronic)    No acute signs of fluid overload.  Take all meds as prescribed. Low sodium diet. Daily weight. Compression socks/elevation.  Follow-up with cardiology       Relevant Medications   amLODipine (NORVASC) 10 MG tablet   carvedilol (COREG) 12.5 MG tablet   hydrochlorothiazide (HYDRODIURIL) 25 MG tablet   rosuvastatin (CRESTOR) 10 MG tablet   Other Relevant Orders   Ambulatory referral to Cardiology   Sleep apnea-like behavior (Chronic)    Referral for sleep study       Relevant Orders   Ambulatory referral to Pulmonology   Other Visit Diagnoses     Encounter for medical examination to establish care    -  Primary    Fatigue, unspecified type     OSA evaluation as above. Labs today    Relevant Orders   CBC with Differential/Platelet   B12 and Folate Panel       Return in about 3 months (around 03/05/2023) for routine follow-up.   Clayborne Dana, NP

## 2022-12-03 NOTE — Assessment & Plan Note (Signed)
Continue Crestor, heart healthy diet, and regular exercise

## 2022-12-03 NOTE — Assessment & Plan Note (Signed)
Referral for sleep study

## 2022-12-03 NOTE — Assessment & Plan Note (Signed)
Pt reports history. She is often fatigued. Labs today

## 2022-12-03 NOTE — Assessment & Plan Note (Signed)
Referral for surgical consult

## 2022-12-03 NOTE — Assessment & Plan Note (Signed)
No acute signs of fluid overload.  Take all meds as prescribed. Low sodium diet. Daily weight. Compression socks/elevation.  Follow-up with cardiology

## 2022-12-03 NOTE — Assessment & Plan Note (Signed)
Blood pressure is at goal for age and co-morbidities.   Recommendations: continue amlodipine 10 mg daily, coreg 12.5 mg BID, and HCTZ 25 mg daily; follow-up with cardiology  - BP goal <130/80 - monitor and log blood pressures at home - check around the same time each day in a relaxed setting - Limit salt to <2000 mg/day - Follow DASH eating plan (heart healthy diet) - limit alcohol to 2 standard drinks per day for men and 1 per day for women - avoid tobacco products - get at least 2 hours of regular aerobic exercise weekly Patient aware of signs/symptoms requiring further/urgent evaluation. Labs updated today.

## 2022-12-03 NOTE — Patient Instructions (Signed)
Labs and referral placed Refilling your medications - please take as directed.    Thank you for choosing [facility] at [facility] for your Primary Care needs. I am excited for the opportunity to partner with you to meet your health care goals. It was a pleasure meeting you today!  Information on diet, exercise, and health maintenance recommendations are listed below. This is information to help you be sure you are on track for optimal health and monitoring.   Please look over this and let us know if you have any questions or if you have completed any of the health maintenance outside of [facility] so that we can be sure your records are up to date.  ___________________________________________________________  MyChart:  For all urgent or time sensitive needs we ask that you please call the office to avoid delays. Our number is [phone]. MyChart is not constantly monitored and due to the large volume of messages a day, replies may take up to 72 business hours.  MyChart Policy: MyChart allows for you to see your visit notes, after visit summary, provider recommendations, lab and tests results, make an appointment, request refills, and contact your provider or the office for non-urgent questions or concerns. Providers are seeing patients during normal business hours and do not have built in time to review MyChart messages.  We ask that you allow a minimum of 3 business days for responses to [employer]. For this reason, please do not send urgent requests through MyChart. Please call the office at [phone]. New and ongoing conditions may require a visit. We have virtual and in-person visits available for your convenience.  Complex MyChart concerns may require a visit. Your provider may request you schedule a virtual or in-person visit to ensure we are providing the best care possible. MyChart messages sent after 11:00 AM on Friday will not be received by the  provider until Monday morning.    Lab and Test Results: You will receive your lab and test results on MyChart as soon as they are completed and results have been sent by the lab or testing facility. Due to this service, you will receive your results BEFORE your provider.  I review lab and test results each morning prior to seeing patients. Some results require collaboration with other providers to ensure you are receiving the most appropriate care. For this reason, we ask that you please allow a minimum of 3-5 business days from the time that ALL results have been received for your provider to receive and review lab and test results and contact you about these.  Most lab and test result comments from the provider will be sent through MyChart. Your provider may recommend changes to the plan of care, follow-up visits, repeat testing, ask questions, or request an office visit to discuss these results. You may reply directly to this message or call the office to provide information for the provider or set up an appointment. In some instances, you will be called with test results and recommendations. Please let us know if this is preferred and we will make note of this in your chart to provide this for you.    If you have not heard a response to your lab or test results in 5 business days from all results returning to MyChart, please call the office to let us know. We ask that you please avoid calling prior to this time unless there is an emergent concern. Due to high call volumes, this  can delay the resulting process.  After Hours: For all non-emergency after hours needs, please call the office at [phone] and select the option to reach the on-call  service. On-call services are shared between multiple [place] offices and therefore it will not be possible to speak directly with your provider. On-call providers may provide medical advice and recommendations, but are unable to provide refills for  maintenance medications.  For all emergency or urgent medical needs after normal business hours, we recommend that you seek care at the closest Urgent Care or Emergency Department to ensure appropriate treatment in a timely manner.  [facility] has a 24 hour emergency room located on the ground floor for your convenience.   Urgent Concerns During the Business Day Providers are seeing patients from 8AM to 5PM with a busy schedule and are most often not able to respond to non-urgent calls until the end of the day or the next business day. If you should have URGENT concerns during the day, please call and speak to the nurse or schedule a same day appointment so that we can address your concern without delay.   Thank you, again, for choosing me as your health care partner. I appreciate your trust and look forward to learning more about you!   Lollie Marrow Reola Calkins, DNP, FNP-C  ___________________________________________________________  Health Maintenance Recommendations Screening Testing Mammogram Every 1-2 years based on history and risk factors Starting at age 32 Pap Smear Ages 21-39 every 3 years Ages 2-65 every 5 years with HPV testing More frequent testing may be required based on results and history Colon Cancer Screening Every 1-10 years based on test performed, risk factors, and history Starting at age 22 Bone Density Screening Every 2-10 years based on history Starting at age 5 for women Recommendations for men differ based on medication usage, history, and risk factors AAA Screening One time ultrasound Men 67-72 years old who have ever smoked Lung Cancer Screening Low Dose Lung CT every 12 months Age 72-80 years with a 20 pack-year smoking history who still smoke or who have quit within the last 15 years  Screening Labs Routine  Labs: Complete Blood Count (CBC), Complete Metabolic Panel (CMP), Cholesterol (Lipid Panel) Every 6-12 months based on history and  medications May be recommended more frequently based on current conditions or previous results Hemoglobin A1c Lab Every 3-12 months based on history and previous results Starting at age 7 or earlier with diagnosis of diabetes, high cholesterol, BMI >26, and/or risk factors Frequent monitoring for patients with diabetes to ensure blood sugar control Thyroid Panel  Every 6 months based on history, symptoms, and risk factors May be repeated more often if on medication HIV One time testing for all patients 67 and older May be repeated more frequently for patients with increased risk factors or exposure Hepatitis C One time testing for all patients 80 and older May be repeated more frequently for patients with increased risk factors or exposure Gonorrhea, Chlamydia Every 12 months for all sexually active persons 13-24 years Additional monitoring may be recommended for those who are considered high risk or who have symptoms PSA Men 72-2 years old with risk factors Additional screening may be recommended from age 7-69 based on risk factors, symptoms, and history  Vaccine Recommendations Tetanus Booster All adults every 10 years Flu Vaccine All patients 6 months and older every year COVID Vaccine All patients 12 years and older Initial dosing with booster May recommend additional booster based on age  and health history HPV Vaccine 2 doses all patients age 37-26 Dosing may be considered for patients over 26 Shingles Vaccine (Shingrix) 2 doses all adults 50 years and older Pneumonia (Pneumovax 23) All adults 65 years and older May recommend earlier dosing based on health history Pneumonia (Prevnar 48) All adults 65 years and older Dosed 1 year after Pneumovax 23 Pneumonia (Prevnar 20) All adults 65 years and older (adults 19-64 with certain conditions or risk factors) 1 dose  For those who have not received Prevnar 13 vaccine previously   Additional Screening, Testing, and  Vaccinations may be recommended on an individualized basis based on family history, health history, risk factors, and/or exposure.  __________________________________________________________  Diet Recommendations for All Patients  I recommend that all patients maintain a diet low in saturated fats, carbohydrates, and cholesterol. While this can be challenging at first, it is not impossible and small changes can make big differences.  Things to try: Decreasing the amount of soda, sweet tea, and/or juice to one or less per day and replace with water While water is always the first choice, if you do not like water you may consider adding a water additive without sugar to improve the taste other sugar free drinks Replace potatoes with a brightly colored vegetable  Use healthy oils, such as canola oil or olive oil, instead of butter or hard margarine Limit your bread intake to two pieces or less a day Replace regular pasta with low carb pasta options Bake, broil, or grill foods instead of frying Monitor portion sizes  Eat smaller, more frequent meals throughout the day instead of large meals  An important thing to remember is, if you love foods that are not great for your health, you don't have to give them up completely. Instead, allow these foods to be a reward when you have done well. Allowing yourself to still have special treats every once in a while is a nice way to tell yourself thank you for working hard to keep yourself healthy.   Also remember that every day is a new day. If you have a bad day and "fall off the wagon", you can still climb right back up and keep moving along on your journey!  We have resources available to help you!  Some websites that may be helpful include: www.http://www.wall-moore.info/  Www.VeryWellFit.com _____________________________________________________________  Activity Recommendations for All Patients  I recommend that all adults get at least 20 minutes of moderate  physical activity that elevates your heart rate at least 5 days out of the week.  Some examples include: Walking or jogging at a pace that allows you to carry on a conversation Cycling (stationary bike or outdoors) Water aerobics Yoga Weight lifting Dancing If physical limitations prevent you from putting stress on your joints, exercise in a pool or seated in a chair are excellent options.  Do determine your MAXIMUM heart rate for activity: 220 - YOUR AGE = MAX Heart Rate   Remember! Do not push yourself too hard.  Start slowly and build up your pace, speed, weight, time in exercise, etc.  Allow your body to rest between exercise and get good sleep. You will need more water than normal when you are exerting yourself. Do not wait until you are thirsty to drink. Drink with a purpose of getting in at least 8, 8 ounce glasses of water a day plus more depending on how much you exercise and sweat.    If you begin to develop dizziness, chest pain,  abdominal pain, jaw pain, shortness of breath, headache, vision changes, lightheadedness, or other concerning symptoms, stop the activity and allow your body to rest. If your symptoms are severe, seek emergency evaluation immediately. If your symptoms are concerning, but not severe, please let us know so that we can recommend further evaluation.

## 2022-12-04 LAB — CBC WITH DIFFERENTIAL/PLATELET
Basophils Absolute: 0.1 10*3/uL (ref 0.0–0.1)
Basophils Relative: 0.7 % (ref 0.0–3.0)
Eosinophils Absolute: 0.2 10*3/uL (ref 0.0–0.7)
Eosinophils Relative: 2.3 % (ref 0.0–5.0)
HCT: 40 % (ref 36.0–46.0)
Hemoglobin: 13.4 g/dL (ref 12.0–15.0)
Lymphocytes Relative: 23.5 % (ref 12.0–46.0)
Lymphs Abs: 1.8 10*3/uL (ref 0.7–4.0)
MCHC: 33.6 g/dL (ref 30.0–36.0)
MCV: 86.2 fl (ref 78.0–100.0)
Monocytes Absolute: 0.6 10*3/uL (ref 0.1–1.0)
Monocytes Relative: 8.3 % (ref 3.0–12.0)
Neutro Abs: 4.9 10*3/uL (ref 1.4–7.7)
Neutrophils Relative %: 65.2 % (ref 43.0–77.0)
Platelets: 388 10*3/uL (ref 150.0–400.0)
RBC: 4.64 Mil/uL (ref 3.87–5.11)
RDW: 12.9 % (ref 11.5–15.5)
WBC: 7.6 10*3/uL (ref 4.0–10.5)

## 2022-12-04 LAB — COMPREHENSIVE METABOLIC PANEL
ALT: 32 U/L (ref 0–35)
AST: 25 U/L (ref 0–37)
Albumin: 3.9 g/dL (ref 3.5–5.2)
Alkaline Phosphatase: 85 U/L (ref 39–117)
BUN: 14 mg/dL (ref 6–23)
CO2: 31 mEq/L (ref 19–32)
Calcium: 9.1 mg/dL (ref 8.4–10.5)
Chloride: 99 mEq/L (ref 96–112)
Creatinine, Ser: 0.81 mg/dL (ref 0.40–1.20)
GFR: 83.15 mL/min (ref >60.00)
Glucose, Bld: 113 mg/dL — ABNORMAL HIGH (ref 70–99)
Potassium: 3.3 mEq/L — ABNORMAL LOW (ref 3.5–5.1)
Sodium: 140 mEq/L (ref 135–145)
Total Bilirubin: 0.4 mg/dL (ref 0.2–1.2)
Total Protein: 7.2 g/dL (ref 6.0–8.3)

## 2022-12-04 LAB — IBC + FERRITIN
Ferritin: 55.8 ng/mL (ref 10.0–291.0)
Iron: 101 ug/dL (ref 42–145)
Saturation Ratios: 31.8 % (ref 20.0–50.0)
TIBC: 317.8 ug/dL (ref 250.0–450.0)
Transferrin: 227 mg/dL (ref 212.0–360.0)

## 2022-12-04 LAB — LIPID PANEL
Cholesterol: 143 mg/dL (ref 0–200)
HDL: 41.4 mg/dL (ref >39.00)
LDL Cholesterol: 65 mg/dL (ref 0–99)
NonHDL: 101.67
Total CHOL/HDL Ratio: 3
Triglycerides: 185 mg/dL — ABNORMAL HIGH (ref 0.0–149.0)
VLDL: 37 mg/dL (ref 0.0–40.0)

## 2022-12-04 LAB — B12 AND FOLATE PANEL
Folate: 13.3 ng/mL (ref >5.9)
Vitamin B-12: 514 pg/mL (ref 211–911)

## 2022-12-04 MED ORDER — POTASSIUM CHLORIDE CRYS ER 20 MEQ PO TBCR
20.0000 meq | EXTENDED_RELEASE_TABLET | Freq: Two times a day (BID) | ORAL | 0 refills | Status: AC
Start: 2022-12-04 — End: 2022-12-07

## 2022-12-04 NOTE — Addendum Note (Signed)
Addended by: Hyman Hopes B on: 12/04/2022 02:00 PM   Modules accepted: Orders

## 2022-12-05 ENCOUNTER — Telehealth: Payer: Self-pay | Admitting: Family Medicine

## 2022-12-05 NOTE — Telephone Encounter (Signed)
Received forms, was it discussed about filling out FMLA/Disability forms? Don't see it mentioned in note.

## 2022-12-05 NOTE — Telephone Encounter (Signed)
Pt dropped off short term disability forms. Pt requests papers be faxed when ready. Papers placed in PCP's tray.

## 2022-12-08 NOTE — Telephone Encounter (Signed)
Patient didn't discuss with provider about any forms. Will need appointment to discuss. Thanks!

## 2022-12-10 NOTE — Telephone Encounter (Signed)
Pt stated the papers are due tomorrow and she is worried she will lose her job. Advised her to sched an appt and let her job know she is in the process of trying to get these papers filled out.

## 2022-12-11 ENCOUNTER — Other Ambulatory Visit (INDEPENDENT_AMBULATORY_CARE_PROVIDER_SITE_OTHER): Payer: 59

## 2022-12-11 DIAGNOSIS — E876 Hypokalemia: Secondary | ICD-10-CM | POA: Diagnosis not present

## 2022-12-11 LAB — POTASSIUM: Potassium: 3.5 mEq/L (ref 3.5–5.1)

## 2022-12-12 ENCOUNTER — Ambulatory Visit: Payer: 59 | Admitting: Family Medicine

## 2022-12-12 ENCOUNTER — Encounter: Payer: Self-pay | Admitting: Internal Medicine

## 2022-12-12 ENCOUNTER — Ambulatory Visit: Payer: 59 | Attending: Internal Medicine | Admitting: Internal Medicine

## 2022-12-12 VITALS — BP 120/82 | HR 62 | Ht 66.5 in | Wt 156.8 lb

## 2022-12-12 DIAGNOSIS — I10 Essential (primary) hypertension: Secondary | ICD-10-CM | POA: Diagnosis not present

## 2022-12-12 NOTE — Progress Notes (Signed)
Cardiology Office Note:    Date:  12/12/2022   ID:  Roberta Young, DOB [birth year], MRN [id number]  PCP:  Clayborne Dana, NP   [facility] Providers Cardiologist:  None     Referring MD: Clayborne Dana, NP   No chief complaint on file. HTN, Elevated LVEDP  History of Present Illness:    Roberta Young is a 52 y.o. female with a hx of HTN emergency, diastolic dysfunction. She was hospitalized 11/15/2022. She endorsed episodes of PND and came to the hospital. Echo showed EF 50-55% and signs of elevated LVEDP. BNP was mild 167.  Her blood pressure was very high and she was managed for hypertensive emergency. She has a sleep study planned.   Today, she feels well. She has had long standing HTN. She notes she has  not taken her medications consistently in the past. When she felt better, she would stop taking her meds. She has 3 children, no hx of preeclampsia. Otherwise, she denies angina, dyspnea on exertion, lower extremity edema, PND or orthopnea.  She works at a [employer], she is active.    Past Medical History:  Diagnosis Date   Acute ear infection    Anemia    Cannabis use disorder, severe, dependence (HCC) 09/12/2019   Depression    Fibroids    History of blood transfusion 06/16/2005   [facility]   Hypertension    Sciatic nerve pain    SVD (spontaneous vaginal delivery)    x 2    Past Surgical History:  Procedure Laterality Date   ABDOMINAL HYSTERECTOMY N/A 05/25/2013   Procedure: HYSTERECTOMY ABDOMINAL;  Surgeon: Adam Phenix, MD;  Location: [facility];  Service: Gynecology;  Laterality: N/A;   ABLATION     BILATERAL SALPINGECTOMY Bilateral 05/25/2013   Procedure: BILATERAL SALPINGECTOMY;  Surgeon: Adam Phenix, MD;  Location: [facility];  Service: Gynecology;  Laterality: Bilateral;   blood transfusion  2007   ? 4 units transfused at [facility]   CESAREAN SECTION     x 1   TUBAL LIGATION      Current Medications: Current  Outpatient Medications on File Prior to Visit  Medication Sig Dispense Refill   amLODipine (NORVASC) 10 MG tablet Take 1 tablet (10 mg total) by mouth daily. 30 tablet 0   carvedilol (COREG) 12.5 MG tablet Take 1 tablet (12.5 mg total) by mouth 2 (two) times daily with a meal. 60 tablet 3   hydrochlorothiazide (HYDRODIURIL) 25 MG tablet Take 1 tablet (25 mg total) by mouth daily. 30 tablet 0   rosuvastatin (CRESTOR) 10 MG tablet Take 1 tablet (10 mg total) by mouth daily. 30 tablet 0   potassium chloride SA (KLOR-CON M) 20 MEQ tablet Take 1 tablet (20 mEq total) by mouth 2 (two) times daily for 3 days. 6 tablet 0   No current facility-administered medications on file prior to visit.    Allergies:   Patient has no known allergies.   Social History   Socioeconomic History   Marital status: Single    Spouse name: Not on file   Number of children: Not on file   Years of education: Not on file   Highest education level: Not on file  Occupational History   Not on file  Tobacco Use   Smoking status: Former    Packs/day: 0.25    Years: 27.00    Additional pack years: 0.00    Total pack years: 6.75  Types: Cigarettes    Quit date: 10/11/2017    Years since quitting: 5.1   Smokeless tobacco: Never  Substance and Sexual Activity   Alcohol use: Yes    Comment: socially   Drug use: Yes    Frequency: 2.0 times per week    Types: Marijuana    Comment: weekends   Sexual activity: Not Currently    Partners: Male    Birth control/protection: Surgical  Other Topics Concern   Not on file  Social History Narrative   Not on file   Social Determinants of Health   Financial Resource Strain: Not on file  Food Insecurity: No Food Insecurity (11/13/2022)   Hunger Vital Sign    Worried About Running Out of Food in the Last Year: Never true    Ran Out of Food in the Last Year: Never true  Transportation Needs: No Transportation Needs (11/13/2022)   PRAPARE - Scientist, research (physical sciences) (Medical): No    Lack of Transportation (Non-Medical): No  Physical Activity: Not on file  Stress: Not on file  Social Connections: Not on file     Family History: The patient's family history includes Arthritis in her mother; Diabetes in her father; Heart disease in her maternal grandfather and maternal grandmother; Hypertension in her father, mother, and sister; Kidney disease in her maternal aunt; Sickle cell trait in her daughter. Grandmother had heart failure  ROS:   Please see the history of present illness.     All other systems reviewed and are negative.  EKGs/Labs/Other Studies Reviewed:    The following studies were reviewed today:      EKG Interpretation Date/Time:  Friday December 12 2022 08:20:22 EDT Ventricular Rate:  61 PR Interval:  188 QRS Duration:  104 QT Interval:  464 QTC Calculation: 467 R Axis:   -14  Text Interpretation: Normal sinus rhythm Possible Left atrial enlargement Left ventricular hypertrophy ( R in aVL , Sokolow-Lyon , Cornell product ) Nonspecific T wave abnormality Prolonged QT When compared with ECG of 13-Nov-2022 01:42, PREVIOUS ECG IS PRESENT Confirmed by Carolan Clines ([id number]) on 12/12/2022 8:30:21 AM   Recent Labs: 11/12/2022: B Natriuretic Peptide 167.5 11/13/2022: Magnesium 2.1 12/03/2022: ALT 32; BUN 14; Creatinine, Ser 0.81; Hemoglobin 13.4; Platelets 388.0; Sodium 140 12/11/2022: Potassium 3.5    Recent Lipid Panel    Component Value Date/Time   CHOL 143 12/03/2022 1437   TRIG 185.0 (H) 12/03/2022 1437   HDL 41.40 12/03/2022 1437   CHOLHDL 3 12/03/2022 1437   VLDL 37.0 12/03/2022 1437   LDLCALC 65 12/03/2022 1437     Risk Assessment/Calculations:     Physical Exam:    VS:  LMP 03/16/2013     Vitals:   12/12/22 0817  BP: 120/82  Pulse: 62  SpO2: 100%     Wt Readings from Last 3 Encounters:  12/03/22 155 lb (70.3 kg)  11/12/22 160 lb (72.6 kg)  05/30/18 130 lb (59 kg)     GEN:  Well nourished, well  developed in no acute distress HEENT: Normal CARDIAC: RRR, no murmurs, rubs, gallops RESPIRATORY:  Clear to auscultation without rales, wheezing or rhonchi  ABDOMEN: Soft, non-tender, non-distended MUSCULOSKELETAL:  No edema; No deformity  SKIN: Warm and dry NEUROLOGIC:  Alert and oriented x 3 PSYCHIATRIC:  Normal affect   ASSESSMENT:    HTN Heart disease - has LVH and  elevated LVEDP - we discussed the importance of taking her medications daily - blood pressure is  well controlled today Current medications: - amlodipine 10 mg daily - coreg 12.5 mg BID - hydrochlorothiazide 25 mg daily    PLAN:    In order of problems listed above:  Continue current regimen Follow up in 6 months           Medication Adjustments/Labs and Tests Ordered: Current medicines are reviewed at length with the patient today.  Concerns regarding medicines are outlined above.  No orders of the defined types were placed in this encounter.  No orders of the defined types were placed in this encounter.   There are no Patient Instructions on file for this visit.   Signed, Maisie Fus, MD  12/12/2022 8:14 AM     [facility]

## 2022-12-12 NOTE — Patient Instructions (Addendum)
Medication Instructions:  No changes  *If you need a refill on your cardiac medications before your next appointment, please call your pharmacy*   Lab Work: Not needed    Testing/Procedures:  Not needed  Follow-Up: At [facility], you and your health needs are our priority.  As part of our continuing mission to provide you with exceptional heart care, we have created designated Provider Care Teams.  These Care Teams include your primary Cardiologist (physician) and Advanced Practice Providers (APPs -  Physician Assistants and Nurse Practitioners) who all work together to provide you with the care you need, when you need it.     Your next appointment:   6 month(s)  The format for your next appointment:   In Person  Provider:   Branch, Mary E, MD    

## 2022-12-17 ENCOUNTER — Encounter: Payer: Self-pay | Admitting: Family Medicine

## 2022-12-17 ENCOUNTER — Ambulatory Visit (INDEPENDENT_AMBULATORY_CARE_PROVIDER_SITE_OTHER): Payer: 59 | Admitting: Family Medicine

## 2022-12-17 VITALS — BP 108/78 | HR 70 | Ht 66.5 in | Wt 157.0 lb

## 2022-12-17 DIAGNOSIS — I10 Essential (primary) hypertension: Secondary | ICD-10-CM

## 2022-12-17 NOTE — Progress Notes (Signed)
   Acute Office Visit  Subjective:     Patient ID: Roberta Young, female    DOB: 1971/02/21, 52 y.o.   MRN: [id number]  Chief Complaint  Patient presents with   Medical Management of Chronic Issues    HPI Patient is in today for work accomodation papers.   She has missed quite a bit of work due to recent hospitalization and appointments. She needs clearance to return to work and is requesting more frequent breaks due to her blood pressure, symptoms, fatigue,dyspnea.  She recently saw cardiology and no medications were changed. Reports she is compliant and has not had recent symptoms. Feeling almost back to baseline    ROS All review of systems negative except what is listed in the HPI      Objective:    BP 108/78   Pulse 70   Ht 5' 6.5" (1.689 m)   Wt 157 lb (71.2 kg)   LMP 03/16/2013   SpO2 100%   BMI 24.96 kg/m    Physical Exam Vitals reviewed.  Constitutional:      Appearance: Normal appearance.  Cardiovascular:     Rate and Rhythm: Normal rate and regular rhythm.     Pulses: Normal pulses.     Heart sounds: Murmur heard.  Pulmonary:     Effort: Pulmonary effort is normal.     Breath sounds: Normal breath sounds.  Skin:    General: Skin is warm and dry.  Neurological:     Mental Status: She is alert and oriented to person, place, and time.  Psychiatric:        Mood and Affect: Mood normal.        Behavior: Behavior normal.        Thought Content: Thought content normal.        Judgment: Judgment normal.     No results found for any visits on 12/17/22.      Assessment & Plan:   Problem List Items Addressed This Visit     Essential hypertension - Primary (Chronic)  Forms filled out for recently missing work due to illness and accomodations for more frequent breaks as we are getting her BP to stabilize. She is continuing to follow with cardiology   No orders of the defined types were placed in this encounter.   Return in about 3 months  (around 03/19/2023) for routine follow-up.  Clayborne Dana, NP

## 2022-12-17 NOTE — Telephone Encounter (Signed)
Pt was calling to advise that she needs her job duty paper filled out, not the whole form. Advised that Ladona Ridgel is requiring an office visit before completing pw. PW is due on Friday. Scheduled appt for today

## 2022-12-22 ENCOUNTER — Telehealth: Payer: Self-pay | Admitting: *Deleted

## 2022-12-22 NOTE — Telephone Encounter (Signed)
Liam Graham- HR manger called and stated that they needed paperwork for disability since pt has restrictions.  Pt cannot have any restrictions at this job.  Advised that we did not get any paperwork.  She stated that they needed this by end of day for pt to keep job.  She will send to my email.  Will await paperwork.

## 2022-12-23 NOTE — Telephone Encounter (Signed)
Paperwork complete. Emailed to [place] at [email or web address]. Copy sent to scan. Copy to hold.

## 2022-12-24 NOTE — Telephone Encounter (Signed)
[facility], let her know I did not receive paperwork. She states just needs updated fitness for duty releasing her to work. Will complete.

## 2022-12-24 NOTE — Telephone Encounter (Signed)
Norberto Sorenson with [employer] called to advise that she emailed Liliana Cline another document today that needs to be completed and returned so pt can return to work. Her callback is [phone]

## 2022-12-25 NOTE — Telephone Encounter (Signed)
Fitness for duty form complete and emailed to sandy at [email or web address].

## 2023-01-22 ENCOUNTER — Telehealth: Payer: Self-pay | Admitting: Family Medicine

## 2023-01-22 DIAGNOSIS — I10 Essential (primary) hypertension: Secondary | ICD-10-CM

## 2023-01-22 DIAGNOSIS — I700 Atherosclerosis of aorta: Secondary | ICD-10-CM

## 2023-01-22 MED ORDER — AMLODIPINE BESYLATE 10 MG PO TABS
10.0000 mg | ORAL_TABLET | Freq: Every day | ORAL | 0 refills | Status: DC
Start: 2023-01-22 — End: 2023-03-27

## 2023-01-22 MED ORDER — HYDROCHLOROTHIAZIDE 25 MG PO TABS: 25.0000 mg | ORAL_TABLET | Freq: Every day | ORAL | 0 refills | Status: AC

## 2023-01-22 MED ORDER — ROSUVASTATIN CALCIUM 10 MG PO TABS
10.0000 mg | ORAL_TABLET | Freq: Every day | ORAL | 0 refills | Status: AC
Start: 2023-01-22 — End: ?

## 2023-01-22 NOTE — Telephone Encounter (Signed)
Prescriptions sent

## 2023-01-22 NOTE — Telephone Encounter (Signed)
Medication: rosuvastatin (CRESTOR) 10 MG tablet  hydrochlorothiazide (HYDRODIURIL) 25 MG tablet  amLODipine (NORVASC) 10 MG tablet  Has the patient contacted their pharmacy? yes  Preferred Pharmacy:   [facility] [address] [place], [place] - [address], [place] [address] Phone: [phone]  Fax: [phone]

## 2023-01-23 ENCOUNTER — Institutional Professional Consult (permissible substitution): Payer: 59 | Admitting: Primary Care

## 2023-03-04 ENCOUNTER — Ambulatory Visit (INDEPENDENT_AMBULATORY_CARE_PROVIDER_SITE_OTHER): Payer: 59 | Admitting: Primary Care

## 2023-03-04 ENCOUNTER — Encounter: Payer: Self-pay | Admitting: Primary Care

## 2023-03-04 VITALS — BP 120/80 | HR 81 | Ht 66.5 in | Wt 161.6 lb

## 2023-03-04 DIAGNOSIS — I10 Essential (primary) hypertension: Secondary | ICD-10-CM

## 2023-03-04 DIAGNOSIS — I5032 Chronic diastolic (congestive) heart failure: Secondary | ICD-10-CM | POA: Diagnosis not present

## 2023-03-04 DIAGNOSIS — I160 Hypertensive urgency: Secondary | ICD-10-CM | POA: Diagnosis not present

## 2023-03-04 DIAGNOSIS — R0681 Apnea, not elsewhere classified: Secondary | ICD-10-CM | POA: Diagnosis not present

## 2023-03-04 NOTE — Assessment & Plan Note (Signed)
-   Patient was hospitalized in May for hypertensive urgency and noted to have apneic periods while sleeping. Medical history significant for congestive heart failure. She has associated symptoms of loud snoring and daytime sleepiness.  Epworth score 10.  BMI 25.  Concern patient could have underlying obstructive sleep apnea, needs in-lab sleep study to evaluate.  We reviewed risks of untreated sleep apnea including cardiac arrhythmias, pulmonary hypertension, diabetes and stroke.  We also discussed treatment options including weight loss, oral appliance, CPAP therapy referral to ENT for possible surgical options.  Encourage side sleeping position elevate head of bed 30 degrees.  Advised against driving if experiencing excessive daytime sleepiness fatigue.  Caution against use of alcohol or sedatives prior to bedtime as these can worsen underlying sleep apnea.  Follow-up 1 to 2 weeks after sleep study review results and treatment options if needed.

## 2023-03-04 NOTE — Progress Notes (Signed)
@Patient  ID: Roberta Young, female    DOB: 22-Feb-1971, 52 y.o.   MRN: [id number]  Chief Complaint  Patient presents with   Consult    Referring provider: Clayborne Dana, NP  HPI: 52 year old female, former smoker.  Past medical history significant for hypertension, chronic diastolic heart failure, aortic arthrosclerosis, sleep apnea-like behavior, former smoker, systolic murmur, hypokalemia, cannabis use disorder.  03/04/2023 Patient presents today for sleep consult. She was hospitalized in May for hypertensive urgency. While admitted she was note to have apneas while sleeping. She has symptoms of loud snoring and daytime sleepiness. She has not been as tired since starting medication for her blood pressure. Typical bedtime is between 9 and 11 PM.  It does not take her long to fall asleep.  She wakes up twice at night to use the restroom.  She starts her day around 5 AM.  Her weight fluctuates between 150 and 167 pounds.  No previous sleep studies.  No concern for narcolepsy, cataplexy or sleepwalking.  Epworth score 10/24.   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  She has children.  She works as a Location manager.  She quit smoking tobacco 8 years ago.  She occasionally uses marijuana to help stimulate appetite.  No Known Allergies   There is no immunization history on file for this patient.  Past Medical History:  Diagnosis Date   Acute ear infection    Anemia    Cannabis use disorder, severe, dependence (HCC) 09/12/2019   Depression    Fibroids    History of blood  transfusion 06/16/2005   [facility]   Hypertension    Sciatic nerve pain    SVD (spontaneous vaginal delivery)    x 2    Tobacco History: Social History   Tobacco Use  Smoking Status Former   Current packs/day: 0.00   Average packs/day: 0.3 packs/day for 27.0 years (6.8 ttl pk-yrs)   Types: Cigarettes   Start date: 10/12/1990   Quit date: 10/11/2017   Years since quitting: 5.3  Smokeless Tobacco Never   Counseling given: Not Answered   Outpatient Medications Prior to Visit  Medication Sig Dispense Refill   amLODipine (NORVASC) 10 MG tablet Take 1 tablet (10 mg total) by mouth daily. 90 tablet 0   carvedilol (COREG) 12.5 MG tablet Take 1 tablet (12.5 mg total) by mouth 2 (two) times daily with a meal. 60 tablet 3   hydrochlorothiazide (HYDRODIURIL) 25 MG tablet Take 1 tablet (25 mg total) by mouth daily. 90 tablet 0   rosuvastatin (CRESTOR) 10 MG tablet Take 1 tablet (10 mg total) by mouth daily. 90 tablet 0   potassium chloride SA (KLOR-CON M) 20 MEQ tablet Take 1 tablet (20 mEq total) by mouth 2 (two) times daily for 3 days. 6 tablet 0   No facility-administered medications prior to visit.   Review of Systems  Review of Systems  Constitutional:  Positive for fatigue.  Respiratory: Negative.    Cardiovascular: Negative.   Psychiatric/Behavioral:  Positive for sleep disturbance.      Physical Exam  BP 120/80 (BP Location:  Right Arm, Patient Position: Sitting, Cuff Size: Normal)   Pulse 81   Ht 5' 6.5" (1.689 m)   Wt 161 lb 9.6 oz (73.3 kg)   LMP 03/16/2013   SpO2 99%   BMI 25.69 kg/m  Physical Exam Constitutional:      General: She is not in acute distress.    Appearance: Normal appearance. She is not ill-appearing.  HENT:     Head: Normocephalic and atraumatic.     Mouth/Throat:     Mouth: Mucous membranes are moist.     Pharynx: Oropharynx is clear.  Cardiovascular:     Rate and Rhythm: Normal rate and regular rhythm.  Pulmonary:      Effort: Pulmonary effort is normal.     Breath sounds: Normal breath sounds.  Musculoskeletal:        General: Normal range of motion.  Skin:    General: Skin is warm and dry.  Neurological:     General: No focal deficit present.     Mental Status: She is alert and oriented to person, place, and time. Mental status is at baseline.  Psychiatric:        Mood and Affect: Mood normal.        Behavior: Behavior normal.        Thought Content: Thought content normal.        Judgment: Judgment normal.      Lab Results:  CBC    Component Value Date/Time   WBC 7.6 12/03/2022 1437   RBC 4.64 12/03/2022 1437   HGB 13.4 12/03/2022 1437   HCT 40.0 12/03/2022 1437   PLT 388.0 12/03/2022 1437   MCV 86.2 12/03/2022 1437   MCH 28.3 11/12/2022 2301   MCHC 33.6 12/03/2022 1437   RDW 12.9 12/03/2022 1437   LYMPHSABS 1.8 12/03/2022 1437   MONOABS 0.6 12/03/2022 1437   EOSABS 0.2 12/03/2022 1437   BASOSABS 0.1 12/03/2022 1437    BMET    Component Value Date/Time   NA 140 12/03/2022 1437   K 3.5 12/11/2022 0937   CL 99 12/03/2022 1437   CO2 31 12/03/2022 1437   GLUCOSE 113 (H) 12/03/2022 1437   BUN 14 12/03/2022 1437   CREATININE 0.81 12/03/2022 1437   CALCIUM 9.1 12/03/2022 1437   GFRNONAA >60 11/15/2022 0904   GFRAA >60 08/21/2015 1145    BNP    Component Value Date/Time   BNP 167.5 (H) 11/12/2022 2301    ProBNP No results found for: "PROBNP"  Imaging: No results found.   Assessment & Plan:   Witnessed episode of apnea - Patient was hospitalized in May for hypertensive urgency and noted to have apneic periods while sleeping. Medical history significant for congestive heart failure. She has associated symptoms of loud snoring and daytime sleepiness.  Epworth score 10.  BMI 25.  Concern patient could have underlying obstructive sleep apnea, needs in-lab sleep study to evaluate.  We reviewed risks of untreated sleep apnea including cardiac arrhythmias, pulmonary  hypertension, diabetes and stroke.  We also discussed treatment options including weight loss, oral appliance, CPAP therapy referral to ENT for possible surgical options.  Encourage side sleeping position elevate head of bed 30 degrees.  Advised against driving if experiencing excessive daytime sleepiness fatigue.  Caution against use of alcohol or sedatives prior to bedtime as these can worsen underlying sleep apnea.  Follow-up 1 to 2 weeks after sleep study review results and treatment options if needed.  Glenford Bayley, NP 03/04/2023

## 2023-03-04 NOTE — Patient Instructions (Addendum)
Sleep apnea is defined as period of 10 seconds or longer when you stop breathing at night. This can happen multiple times a night. Dx sleep apnea is when this occurs more than 5 times an hour.    Mild OSA 5-15 apneic events an hour Moderate OSA 15-30 apneic events an hour Severe OSA > 30 apneic events an hour   Untreated sleep apnea puts you at higher risk for cardiac arrhythmias, pulmonary HTN, stroke and diabetes  Treatment options include weight loss, side sleeping position, oral appliance, CPAP therapy or referral to ENT for possible surgical options    Recommendations: Focus on side sleeping position or elevate head with wedge pillow 30 degrees Do bot drink alcohol or take sedative prior to bedtime as these can worsen sleep apnea  Do not drive if experiencing excessive daytime sleepiness of fatigue    Orders: In lab sleep study re: loud snoring    Follow-up: Please call to schedule follow-up 1-2 weeks after completing home sleep study to review results and treatment if needed (can be virtual)  Sleep Apnea Sleep apnea affects breathing during sleep. It causes breathing to stop for 10 seconds or more, or to become shallow. People with sleep apnea usually snore loudly. It can also increase the risk of: Heart attack. Stroke. Being very overweight (obese). Diabetes. Heart failure. Irregular heartbeat. High blood pressure. The goal of treatment is to help you breathe normally again. What are the causes?  The most common cause of this condition is a collapsed or blocked airway. There are three kinds of sleep apnea: Obstructive sleep apnea. This is caused by a blocked or collapsed airway. Central sleep apnea. This happens when the brain does not send the right signals to the muscles that control breathing. Mixed sleep apnea. This is a combination of obstructive and central sleep apnea. What increases the risk? Being overweight. Smoking. Having a small airway. Being  older. Being female. Drinking alcohol. Taking medicines to calm yourself (sedatives or tranquilizers). Having family members with the condition. Having a tongue or tonsils that are larger than normal. What are the signs or symptoms? Trouble staying asleep. Loud snoring. Headaches in the morning. Waking up gasping. Dry mouth or sore throat in the morning. Being sleepy or tired during the day. If you are sleepy or tired during the day, you may also: Not be able to focus your mind (concentrate). Forget things. Get angry a lot and have mood swings. Feel sad (depressed). Have changes in your personality. Have less interest in sex, if you are female. Be unable to have an erection, if you are female. How is this treated?  Sleeping on your side. Using a medicine to get rid of mucus in your nose (decongestant). Avoiding the use of alcohol, medicines to help you relax, or certain pain medicines (narcotics). Losing weight, if needed. Changing your diet. Quitting smoking. Using a machine to open your airway while you sleep, such as: An oral appliance. This is a mouthpiece that shifts your lower jaw forward. A CPAP device. This device blows air through a mask when you breathe out (exhale). An EPAP device. This has valves that you put in each nostril. A BIPAP device. This device blows air through a mask when you breathe in (inhale) and breathe out. Having surgery if other treatments do not work. Follow these instructions at home: Lifestyle Make changes that your doctor recommends. Eat a healthy diet. Lose weight if needed. Avoid alcohol, medicines to help you relax, and some  pain medicines. Do not smoke or use any products that contain nicotine or tobacco. If you need help quitting, ask your doctor. General instructions Take over-the-counter and prescription medicines only as told by your doctor. If you were given a machine to use while you sleep, use it only as told by your doctor. If you  are having surgery, make sure to tell your doctor you have sleep apnea. You may need to bring your device with you. Keep all follow-up visits. Contact a doctor if: The machine that you were given to use during sleep bothers you or does not seem to be working. You do not get better. You get worse. Get help right away if: Your chest hurts. You have trouble breathing in enough air. You have an uncomfortable feeling in your back, arms, or stomach. You have trouble talking. One side of your body feels weak. A part of your face is hanging down. These symptoms may be an emergency. Get help right away. Call your local emergency services (911 in the [place] Do not wait to see if the symptoms will go away. Do not drive yourself to the hospital. Summary This condition affects breathing during sleep. The most common cause is a collapsed or blocked airway. The goal of treatment is to help you breathe normally while you sleep. This information is not intended to replace advice given to you by your health care provider. Make sure you discuss any questions you have with your health care provider. Document Revised: 01/09/2021 Document Reviewed: 05/11/2020 Elsevier Patient Education  2024 [employer].

## 2023-03-26 ENCOUNTER — Telehealth: Payer: Self-pay | Admitting: Family Medicine

## 2023-03-26 DIAGNOSIS — I10 Essential (primary) hypertension: Secondary | ICD-10-CM

## 2023-03-26 NOTE — Telephone Encounter (Signed)
Prescription Request  03/26/2023  Is this a "Controlled Substance" medicine? No  LOV: 12/17/2022  What is the name of the medication or equipment? amLODipine (NORVASC) 10 MG tablet  Have you contacted your pharmacy to request a refill? Yes   Which pharmacy would you like this sent to?  [id number] - [place], [place] - [address] [place] [address] Phone: [phone] Fax: [phone]    Patient notified that their request is being sent to the clinical staff for review and that they should receive a response within 2 business days.   Please advise at [facility] [phone]

## 2023-03-27 ENCOUNTER — Other Ambulatory Visit: Payer: Self-pay

## 2023-03-27 DIAGNOSIS — I10 Essential (primary) hypertension: Secondary | ICD-10-CM

## 2023-03-27 MED ORDER — AMLODIPINE BESYLATE 10 MG PO TABS
10.0000 mg | ORAL_TABLET | Freq: Every day | ORAL | 0 refills | Status: AC
Start: 2023-03-27 — End: ?

## 2023-03-27 NOTE — Telephone Encounter (Signed)
Rx sent 

## 2023-05-07 ENCOUNTER — Telehealth: Payer: Self-pay | Admitting: Primary Care

## 2023-05-07 DIAGNOSIS — R0681 Apnea, not elsewhere classified: Secondary | ICD-10-CM

## 2023-05-07 NOTE — Telephone Encounter (Signed)
Authorization for sleep study was denied per Call center rep at [phone].  Denial letter faxed to [phone]. Rep states that the clinicals did not provide enough information for study to be medically necessary.  P2P can be done by calling [phone]. Will get more info about the process if can be completed today.  Last day to appeal is 11/24.

## 2023-05-18 ENCOUNTER — Ambulatory Visit: Payer: 59 | Attending: Internal Medicine | Admitting: Internal Medicine

## 2023-05-18 NOTE — Progress Notes (Unsigned)
Cardiology Office Note:    Date:  05/18/2023   ID:  CHANTRA WERTHEIMER, DOB 07-May-1971, MRN [id number]  PCP:  Clayborne Dana, NP   [facility] Providers Cardiologist:  Maisie Fus, MD     Referring MD: Clayborne Dana, NP   No chief complaint on file. HTN, Elevated LVEDP  History of Present Illness:    Roberta Young is a 52 y.o. female with a hx of HTN emergency, diastolic dysfunction. She was hospitalized 11/15/2022. She endorsed episodes of PND and came to the hospital. Echo showed EF 50-55% and signs of elevated LVEDP. BNP was mild 167.  Her blood pressure was very high and she was managed for hypertensive emergency. She has a sleep study planned.   Today, she feels well. She has had long standing HTN. She notes she has  not taken her medications consistently in the past. When she felt better, she would stop taking her meds. She has 3 children, no hx of preeclampsia. Otherwise, she denies angina, dyspnea on exertion, lower extremity edema, PND or orthopnea.  She works at a [employer], she is active.   Family History: The patient's family history includes Arthritis in her mother; Diabetes in her father; Heart disease in her maternal grandfather and maternal grandmother; Hypertension in her father, mother, and sister; Kidney disease in her maternal aunt; Sickle cell trait in her daughter. Grandmother had heart failure  ROS:   Please see the history of present illness.     All other systems reviewed and are negative.  EKGs/Labs/Other Studies Reviewed:    The following studies were reviewed today:      EKG Interpretation Date/Time:    Ventricular Rate:    PR Interval:    QRS Duration:    QT Interval:    QTC Calculation:   R Axis:      Text Interpretation:     Recent Labs: 11/12/2022: B Natriuretic Peptide 167.5 11/13/2022: Magnesium 2.1 12/03/2022: ALT 32; BUN 14; Creatinine, Ser 0.81; Hemoglobin 13.4; Platelets 388.0; Sodium 140 12/11/2022: Potassium 3.5     Recent Lipid Panel    Component Value Date/Time   CHOL 143 12/03/2022 1437   TRIG 185.0 (H) 12/03/2022 1437   HDL 41.40 12/03/2022 1437   CHOLHDL 3 12/03/2022 1437   VLDL 37.0 12/03/2022 1437   LDLCALC 65 12/03/2022 1437     Risk Assessment/Calculations:     Physical Exam:    VS:  ***  Wt Readings from Last 3 Encounters:  03/04/23 161 lb 9.6 oz (73.3 kg)  12/17/22 157 lb (71.2 kg)  12/12/22 156 lb 12.8 oz (71.1 kg)     GEN:  Well nourished, well developed in no acute distress HEENT: Normal CARDIAC: RRR, no murmurs, rubs, gallops RESPIRATORY:  Clear to auscultation without rales, wheezing or rhonchi  ABDOMEN: Soft, non-tender, non-distended MUSCULOSKELETAL:  No edema; No deformity  SKIN: Warm and dry NEUROLOGIC:  Alert and oriented x 3 PSYCHIATRIC:  Normal affect   ASSESSMENT:    HTN Heart disease - has LVH and  elevated LVEDP on echo, asymptomatic***. BP is *** controlled - we discussed the importance of taking her medications daily - blood pressure is well controlled today Current medications: - amlodipine 10 mg daily - coreg 12.5 mg BID - hydrochlorothiazide 25 mg daily    PLAN:    In order of problems listed above:  Continue current regimen Follow up in 6 months  Medication Adjustments/Labs and Tests Ordered: Current medicines are reviewed  at length with the patient today.  Concerns regarding medicines are outlined above.  No orders of the defined types were placed in this encounter.  No orders of the defined types were placed in this encounter.   There are no Patient Instructions on file for this visit.   Signed, Maisie Fus, MD  05/18/2023 8:43 AM    [facility]

## 2023-05-28 NOTE — Telephone Encounter (Signed)
Left vm to provider line for an update on this case    PH: [phone]

## 2023-06-05 NOTE — Telephone Encounter (Signed)
REF# [id number]   CASE STILL DENIED   Rep is faxing denial letter to [phone]

## 2023-06-05 NOTE — Telephone Encounter (Signed)
I will place a new order for a home sleep study

## 2023-07-01 ENCOUNTER — Ambulatory Visit: Payer: 59 | Admitting: Internal Medicine

## 2023-07-21 ENCOUNTER — Ambulatory Visit: Payer: 59 | Attending: Internal Medicine | Admitting: Internal Medicine

## 2023-07-21 NOTE — Progress Notes (Deleted)
 Cardiology Office Note:    Date:  07/21/2023   ID:  Roberta Young, DOB 12-28-1970, MRN [id number]  PCP:  Almarie Waddell NOVAK, NP   [facility] Providers Cardiologist:  Alvan Ronal BRAVO, MD     Referring MD: Almarie Waddell NOVAK, NP   No chief complaint on file. HTN, Elevated LVEDP  History of Present Illness:    Roberta Young is a 53 y.o. female with a hx of HTN emergency, diastolic dysfunction. She was hospitalized 11/15/2022. She endorsed episodes of PND and came to the hospital. Echo showed EF 50-55% and signs of elevated LVEDP. BNP was mild 167.  Her blood pressure was very high and she was managed for hypertensive emergency. She has a sleep study planned.   Today, she feels well. She has had long standing HTN. She notes she has  not taken her medications consistently in the past. When she felt better, she would stop taking her meds. She has 3 children, no hx of preeclampsia. Otherwise, she denies angina, dyspnea on exertion, lower extremity edema, PND or orthopnea.  She works at a [employer], she is active.    Past Medical History:  Diagnosis Date   Acute ear infection    Anemia    Cannabis use disorder, severe, dependence (HCC) 09/12/2019   Depression    Fibroids    History of blood transfusion 06/16/2005   [facility]   Hypertension    Sciatic nerve pain    SVD (spontaneous vaginal delivery)    x 2    Past Surgical History:  Procedure Laterality Date   ABDOMINAL HYSTERECTOMY N/A 05/25/2013   Procedure: HYSTERECTOMY ABDOMINAL;  Surgeon: Lynwood KANDICE Solomons, MD;  Location: [facility];  Service: Gynecology;  Laterality: N/A;   ABLATION     BILATERAL SALPINGECTOMY Bilateral 05/25/2013   Procedure: BILATERAL SALPINGECTOMY;  Surgeon: Lynwood KANDICE Solomons, MD;  Location: [facility];  Service: Gynecology;  Laterality: Bilateral;   blood transfusion  2007   ? 4 units transfused at [facility]   CESAREAN SECTION     x 1   TUBAL LIGATION      Current  Medications: Current Outpatient Medications on File Prior to Visit  Medication Sig Dispense Refill   amLODipine  (NORVASC ) 10 MG tablet Take 1 tablet (10 mg total) by mouth daily. 90 tablet 0   carvedilol  (COREG ) 12.5 MG tablet Take 1 tablet (12.5 mg total) by mouth 2 (two) times daily with a meal. 60 tablet 3   hydrochlorothiazide  (HYDRODIURIL ) 25 MG tablet Take 1 tablet (25 mg total) by mouth daily. 90 tablet 0   potassium chloride  SA (KLOR-CON  M) 20 MEQ tablet Take 1 tablet (20 mEq total) by mouth 2 (two) times daily for 3 days. 6 tablet 0   rosuvastatin  (CRESTOR ) 10 MG tablet Take 1 tablet (10 mg total) by mouth daily. 90 tablet 0   No current facility-administered medications on file prior to visit.    Allergies:   Patient has no known allergies.   Social History   Socioeconomic History   Marital status: Single    Spouse name: Not on file   Number of children: Not on file   Years of education: Not on file   Highest education level: Not on file  Occupational History   Not on file  Tobacco Use   Smoking status: Former    Current packs/day: 0.00    Average packs/day: 0.3 packs/day for 27.0 years (6.8 ttl pk-yrs)  Types: Cigarettes    Start date: 10/12/1990    Quit date: 10/11/2017    Years since quitting: 5.7   Smokeless tobacco: Never  Substance and Sexual Activity   Alcohol use: Yes    Comment: socially   Drug use: Yes    Frequency: 2.0 times per week    Types: Marijuana    Comment: weekends   Sexual activity: Not Currently    Partners: Male    Birth control/protection: Surgical  Other Topics Concern   Not on file  Social History Narrative   Not on file   Social Drivers of Health   Financial Resource Strain: Medium Risk (09/06/2018)   Received from [facility] visits prior to 08/16/2022., [facility] visits prior to 08/16/2022.   Overall Financial Resource Strain (CARDIA)    Difficulty of Paying Living Expenses: Somewhat  hard  Food Insecurity: No Food Insecurity (11/13/2022)   Hunger Vital Sign    Worried About Running Out of Food in the Last Year: Never true    Ran Out of Food in the Last Year: Never true  Transportation Needs: No Transportation Needs (11/13/2022)   PRAPARE - Administrator, Civil Service (Medical): No    Lack of Transportation (Non-Medical): No  Physical Activity: Not on file  Stress: Not on file  Social Connections: Not on file     Family History: The patient's family history includes Arthritis in her mother; Diabetes in her father; Heart disease in her maternal grandfather and maternal grandmother; Hypertension in her father, mother, and sister; Kidney disease in her maternal aunt; Sickle cell trait in her daughter. Grandmother had heart failure  ROS:   Please see the history of present illness.     All other systems reviewed and are negative.  EKGs/Labs/Other Studies Reviewed:    The following studies were reviewed today:      EKG Interpretation Date/Time:    Ventricular Rate:    PR Interval:    QRS Duration:    QT Interval:    QTC Calculation:   R Axis:      Text Interpretation:     Recent Labs: 11/12/2022: B Natriuretic Peptide 167.5 11/13/2022: Magnesium 2.1 12/03/2022: ALT 32; BUN 14; Creatinine, Ser 0.81; Hemoglobin 13.4; Platelets 388.0; Sodium 140 12/11/2022: Potassium 3.5    Recent Lipid Panel    Component Value Date/Time   CHOL 143 12/03/2022 1437   TRIG 185.0 (H) 12/03/2022 1437   HDL 41.40 12/03/2022 1437   CHOLHDL 3 12/03/2022 1437   VLDL 37.0 12/03/2022 1437   LDLCALC 65 12/03/2022 1437     Risk Assessment/Calculations:     Physical Exam:    VS:  LMP 03/16/2013     There were no vitals filed for this visit.    Wt Readings from Last 3 Encounters:  03/04/23 161 lb 9.6 oz (73.3 kg)  12/17/22 157 lb (71.2 kg)  12/12/22 156 lb 12.8 oz (71.1 kg)     GEN:  Well nourished, well developed in no acute distress HEENT:  Normal CARDIAC: RRR, no murmurs, rubs, gallops RESPIRATORY:  Clear to auscultation without rales, wheezing or rhonchi  ABDOMEN: Soft, non-tender, non-distended MUSCULOSKELETAL:  No edema; No deformity  SKIN: Warm and dry NEUROLOGIC:  Alert and oriented x 3 PSYCHIATRIC:  Normal affect   ASSESSMENT:    HTN Heart disease - has LVH and  elevated LVEDP - we discussed the importance of taking her medications daily - blood pressure is  well controlled today Current medications: - amlodipine  10 mg daily - coreg  12.5 mg BID - hydrochlorothiazide  25 mg daily    PLAN:    In order of problems listed above:  Continue current regimen Follow up in 6 months           Medication Adjustments/Labs and Tests Ordered: Current medicines are reviewed at length with the patient today.  Concerns regarding medicines are outlined above.  No orders of the defined types were placed in this encounter.  No orders of the defined types were placed in this encounter.   There are no Patient Instructions on file for this visit.   Signed, Alvan Ronal BRAVO, MD  07/21/2023 10:28 AM    [facility]
